# Patient Record
Sex: Female | Born: 1970 | Race: White | Hispanic: No | Marital: Married | Smoking: Never smoker
Health system: Southern US, Community
[De-identification: ages and names within clinical notes are randomized; demographics above are authoritative.]

## PROBLEM LIST (undated history)

## (undated) HISTORY — PX: CHOLECYSTECTOMY: SHX55

## (undated) HISTORY — PX: APPENDECTOMY: SHX54

## (undated) HISTORY — PX: KIDNEY SURGERY: SHX687

## (undated) HISTORY — PX: SHOULDER ARTHROSCOPY: SHX128

---

## 2018-04-03 DIAGNOSIS — R21 Rash and other nonspecific skin eruption: Secondary | ICD-10-CM | POA: Diagnosis not present

## 2018-04-17 DIAGNOSIS — Z1231 Encounter for screening mammogram for malignant neoplasm of breast: Secondary | ICD-10-CM | POA: Diagnosis not present

## 2018-04-19 DIAGNOSIS — L814 Other melanin hyperpigmentation: Secondary | ICD-10-CM | POA: Diagnosis not present

## 2018-04-19 DIAGNOSIS — L821 Other seborrheic keratosis: Secondary | ICD-10-CM | POA: Diagnosis not present

## 2018-04-19 DIAGNOSIS — D225 Melanocytic nevi of trunk: Secondary | ICD-10-CM | POA: Diagnosis not present

## 2018-06-21 ENCOUNTER — Other Ambulatory Visit: Payer: Self-pay | Admitting: Internal Medicine

## 2018-06-21 DIAGNOSIS — H539 Unspecified visual disturbance: Secondary | ICD-10-CM | POA: Diagnosis not present

## 2018-06-21 DIAGNOSIS — R42 Dizziness and giddiness: Secondary | ICD-10-CM | POA: Diagnosis not present

## 2018-06-21 DIAGNOSIS — R51 Headache: Secondary | ICD-10-CM | POA: Diagnosis not present

## 2018-06-21 DIAGNOSIS — G43809 Other migraine, not intractable, without status migrainosus: Secondary | ICD-10-CM | POA: Diagnosis not present

## 2018-06-21 DIAGNOSIS — G4489 Other headache syndrome: Secondary | ICD-10-CM

## 2018-06-21 DIAGNOSIS — Z83511 Family history of glaucoma: Secondary | ICD-10-CM | POA: Diagnosis not present

## 2018-07-04 ENCOUNTER — Other Ambulatory Visit: Payer: Self-pay

## 2018-07-04 ENCOUNTER — Ambulatory Visit
Admission: RE | Admit: 2018-07-04 | Discharge: 2018-07-04 | Disposition: A | Discharge status: Home / Self Care | Payer: 59 | Source: Ambulatory Visit | Attending: Internal Medicine | Admitting: Internal Medicine

## 2018-07-04 DIAGNOSIS — H539 Unspecified visual disturbance: Secondary | ICD-10-CM

## 2018-07-04 DIAGNOSIS — G4489 Other headache syndrome: Secondary | ICD-10-CM

## 2018-07-04 IMAGING — MR MRI HEAD WITHOUT CONTRAST
10 series · 48 of 48 positions shown · non-contrast
Comparison: None.

CLINICAL DATA: Headaches with vision changes since [REDACTED] [6O].

EXAM:
MRI HEAD WITHOUT CONTRAST
TECHNIQUE: Multiplanar, multiecho pulse sequences of the brain and surrounding
structures were obtained without intravenous contrast.

[Series 2: t1_se_sag · sagittal · 5.0mm · 0.45mm/px · 1 of 19 slices shown]
[im 1/19]
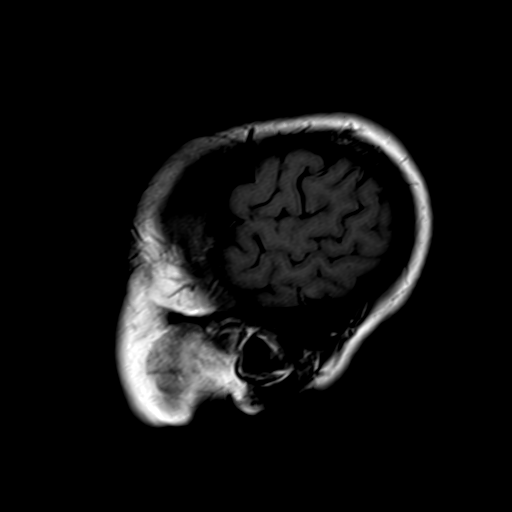

[Series 3: t2_tse_tra_512 · axial · 5.0mm · 0.72mm/px · z∈[-56,+94]mm · 3 of 26 slices shown]
[im 1/26]
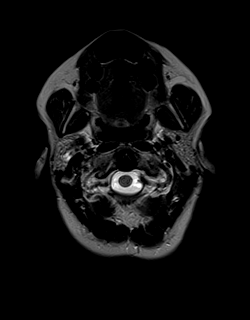
[im 13/26]
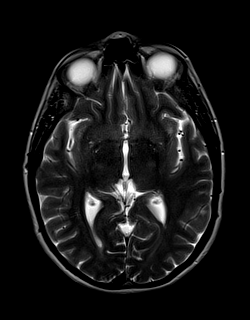
[im 26/26]
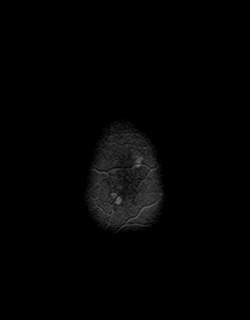

[Series 4: ep2d_diff_(id)_trace · axial · 3.0mm · 1.80mm/px · z∈[-50,+93]mm · 9 of 97 slices shown]
[im 1/97]
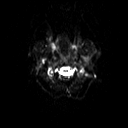
[im 13/97]
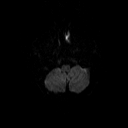
[im 25/97]
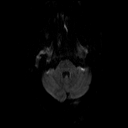
[im 37/97]
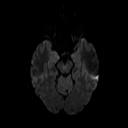
[im 49/97]
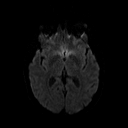
[im 61/97]
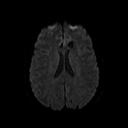
[im 73/97]
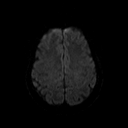
[im 85/97]
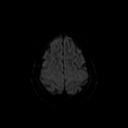
[im 97/97]
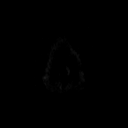

[Series 5: ep2d_diff_(id)_trace_adc · axial · 3.0mm · 1.80mm/px · z∈[-50,+93]mm · 5 of 47 slices shown]
[im 1/47]
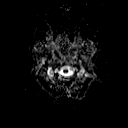
[im 12/47]
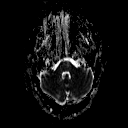
[im 24/47]
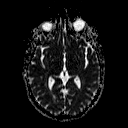
[im 35/47]
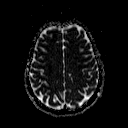
[im 47/47]
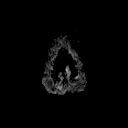

[Series 6: ep2d_diff_cor · coronal · 5.0mm · 1.77mm/px · 5 of 50 slices shown]
[im 1/50]
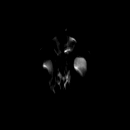
[im 13/50]
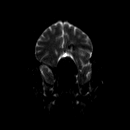
[im 25/50]
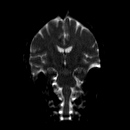
[im 37/50]
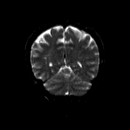
[im 50/50]
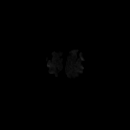

[Series 7: ep2d_diff_cor_adc · coronal · 5.0mm · 1.77mm/px · 2 of 25 slices shown]
[im 1/25]
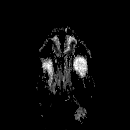
[im 25/25]
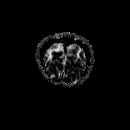

[Series 9: swi_images · axial · 4.0mm · 0.90mm/px · z∈[-51,+89]mm · 3 of 36 slices shown]
[im 1/36]
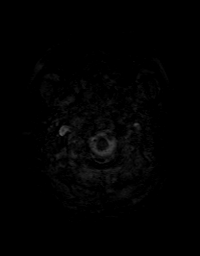
[im 18/36]
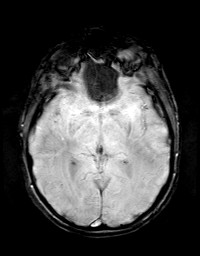
[im 36/36]
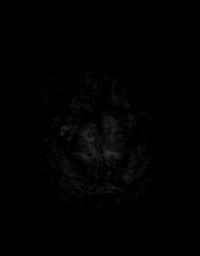

[Series 10: FLAIR · axial · 3.0mm · 0.43mm/px · z∈[-60,+96]mm · 3 of 27 slices shown]
[im 1/27]
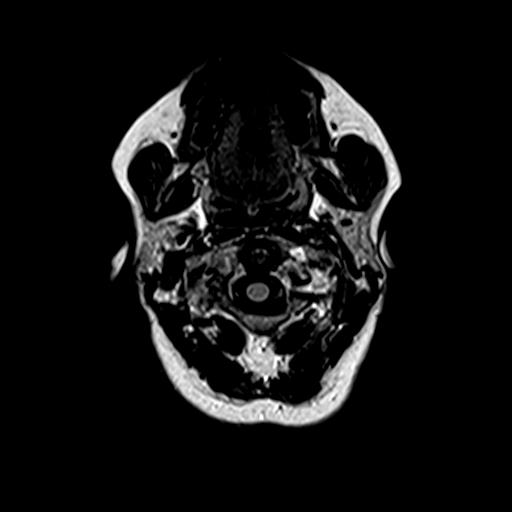
[im 14/27]
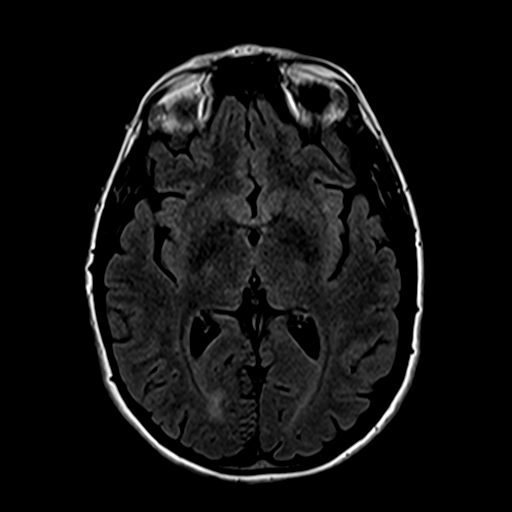
[im 27/27]
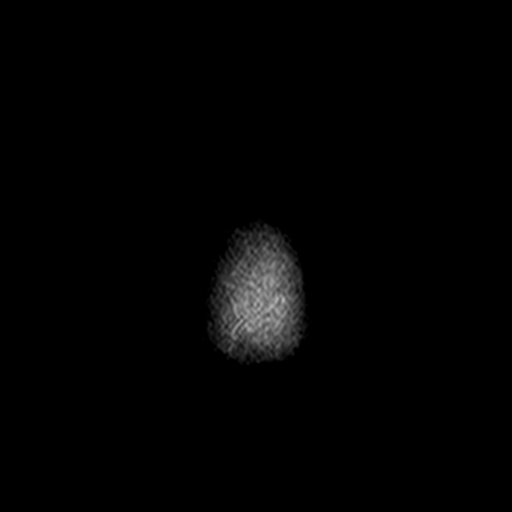

[Series 11: t1_mpr_tra · axial · 1.0mm · 0.72mm/px · z∈[-52,+90]mm · 14 of 144 slices shown]
[im 1/144]
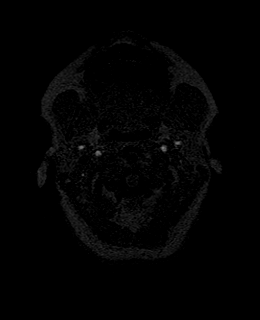
[im 12/144]
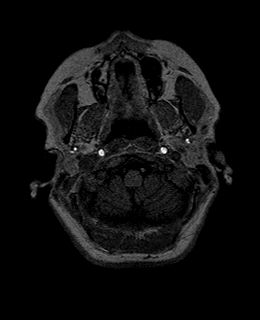
[im 23/144]
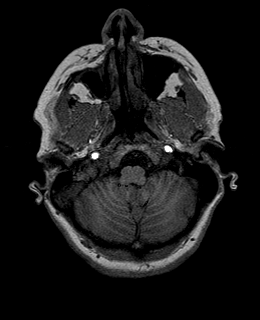
[im 34/144]
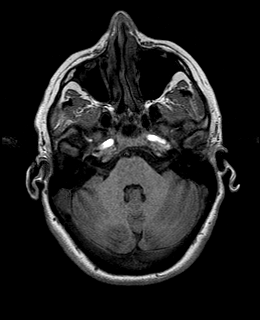
[im 45/144]
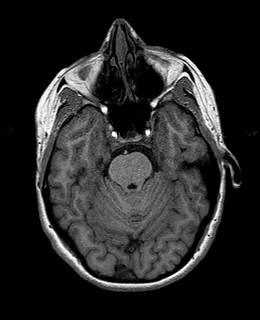
[im 56/144]
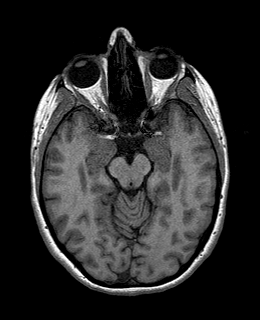
[im 67/144]
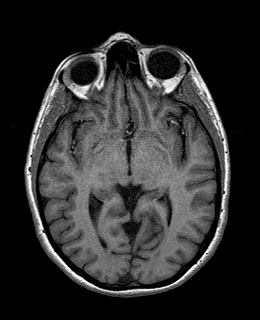
[im 78/144]
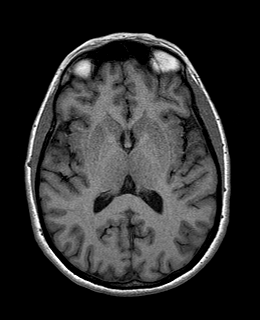
[im 89/144]
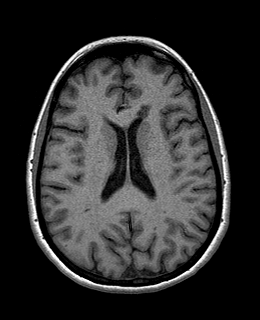
[im 100/144]
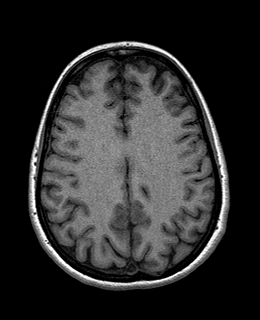
[im 111/144]
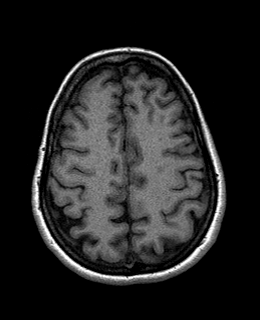
[im 122/144]
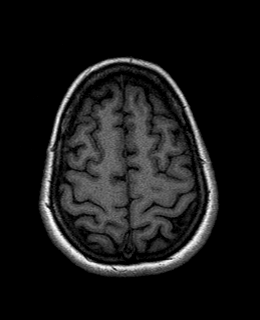
[im 133/144]
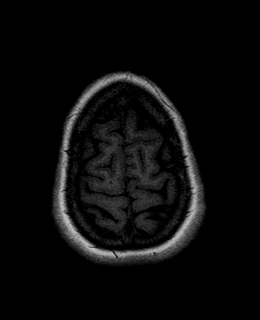
[im 144/144]
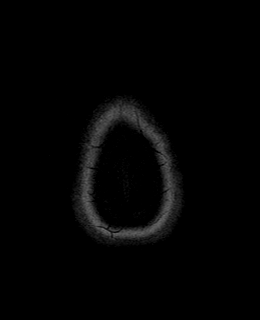

[Series 12: T2 · coronal · 5.0mm · 0.45mm/px · 3 of 26 slices shown]
[im 1/26]
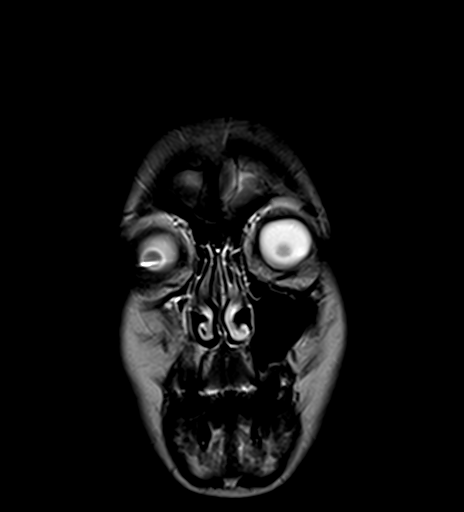
[im 13/26]
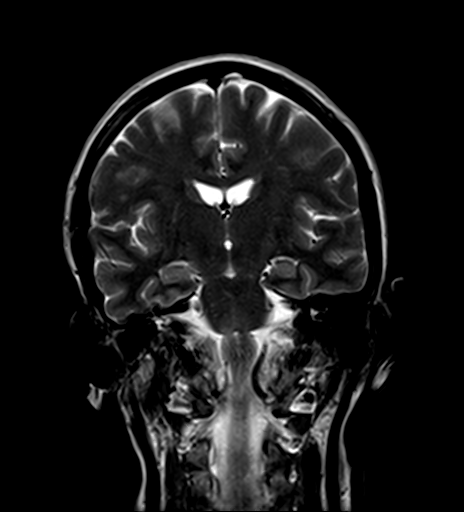
[im 26/26]
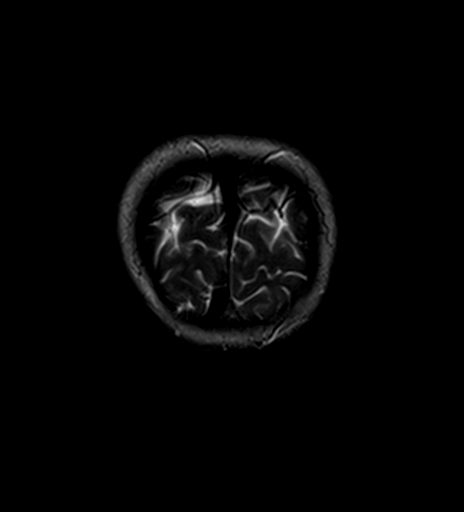

[48 of 48 positions shown; findings below may reference images not displayed]

FINDINGS: Brain: No acute infarct, intracranial mass effect, or extra-axial
fluid collection is identified. The ventricles and sulci are normal
in size. There is a 12 x 8 mm lesion abutting the frontal horn of
the left lateral ventricle which demonstrates heterogeneous T2
hyperintensity, a hemosiderin ring, and extensive susceptibility
artifact related to chronic blood products. There is no associated
edema. The brain is otherwise normal in signal.

Vascular: Major intracranial vascular flow voids are preserved.

Skull and upper cervical spine: Unremarkable bone marrow signal.

Sinuses/Orbits: Unremarkable orbits. Mild right maxillary and right
sphenoid sinus mucosal thickening. Clear mastoid air cells.

Other: None.
IMPRESSION: 1. 12 mm left frontal cavernoma. No edema or other evidence of acute
hemorrhage.
2. Otherwise unremarkable appearance of the brain.

## 2018-07-10 NOTE — Progress Notes (Signed)
error 

## 2018-07-11 ENCOUNTER — Telehealth: Payer: 59 | Admitting: Neurology

## 2018-07-11 ENCOUNTER — Other Ambulatory Visit: Payer: Self-pay

## 2018-07-11 ENCOUNTER — Encounter: Payer: Self-pay | Admitting: Neurology

## 2018-07-24 ENCOUNTER — Encounter: Payer: Self-pay | Admitting: Neurology

## 2018-07-24 ENCOUNTER — Other Ambulatory Visit: Payer: Self-pay

## 2018-07-24 ENCOUNTER — Ambulatory Visit: Payer: 59 | Admitting: Neurology

## 2018-07-24 VITALS — BP 111/76 | HR 90 | Temp 98.4°F | Ht 66.0 in | Wt 127.0 lb

## 2018-07-24 DIAGNOSIS — G43109 Migraine with aura, not intractable, without status migrainosus: Secondary | ICD-10-CM

## 2018-07-24 MED ORDER — RIZATRIPTAN BENZOATE 10 MG PO TABS: ORAL_TABLET | ORAL | 0 refills | Status: DC

## 2018-07-24 NOTE — Patient Instructions (Signed)
1.  Take rizatriptan 10mg  at earliest onset of migraine.  May repeat once in 2 hours if needed.  Maximum 2 tablets in 24 hours. 2.  Follow up in one year.

## 2018-07-24 NOTE — Progress Notes (Signed)
NEUROLOGY CONSULTATION NOTE  April Yoder MRN: [id number] DOB: [birth year]  Referring provider: Wenda Low, MD Primary care provider: Wenda Low, MD  Reason for consult:  migraine  HISTORY OF PRESENT ILLNESS: April Yoder is a 48 year old woman who presents for migraine.  In April, she had visual disturbance described as flashes in vision with development of scotoma surrounded by bright flashes or prism effect.  It involves both eyes.  She is unable to see. Towards the end of the day, she developed a headache, severe left frontal non-throbbing headache.  It was associated with dizziness but not nausea, vomiting, photophobia, phonophobia or unilateral numbness or weakness.  Visual aura and headache are severe the first day but both symptoms last and gradually resolve over 3 to 4 days.  No specific triggers.  Resolved spontaneously.  She had an eye exam which was normal.   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  No stroke.  She had a similar event last summer but not as severe, lasting about a day.  No history of headaches.  Her sister has migraines.  PAST MEDICAL HISTORY: History reviewed. No pertinent past medical history.  PAST SURGICAL HISTORY: Past Surgical History:  Procedure Laterality Date  . APPENDECTOMY    . CHOLECYSTECTOMY    . KIDNEY SURGERY Left    left kidney removed  . SHOULDER ARTHROSCOPY Bilateral     MEDICATIONS: Current Outpatient Medications on File Prior to Visit  Medication Sig Dispense Refill  . loratadine (CLARITIN) 10 MG tablet Take 10 mg by mouth daily.    . Multiple Vitamin (MULTIVITAMIN) tablet Take 1 tablet by mouth daily.     No current facility-administered medications on file prior to visit.     ALLERGIES: Not on File  FAMILY HISTORY: Family History  Problem Relation Age of Onset  . Breast cancer  Mother 16  . Polycystic kidney disease Father   . Polycystic kidney disease Sister     SOCIAL HISTORY: Social History   Socioeconomic History  . Marital status: Married    Spouse name: April Yoder  . Number of children: 2  . Years of education: Not on file  . Highest education level: Some college, no degree  Occupational History  . Occupation: retired  Scientific laboratory technician  . Financial resource strain: Not on file  . Food insecurity:    Worry: Not on file    Inability: Not on file  . Transportation needs:    Medical: Not on file    Non-medical: Not on file  Tobacco Use  . Smoking status: Never Smoker  . Smokeless tobacco: Never Used  Substance and Sexual Activity  . Alcohol use: Yes    Frequency: Never    Comment: 2 x week  . Drug use: Never  . Sexual activity: Yes  Lifestyle  . Physical activity:    Days per week: Not on file    Minutes per session: Not on file  . Stress: Not on file  Relationships  . Social connections:    Talks on phone: Not on file    Gets together: Not on file    Attends religious service: Not on file    Active member of club or organization: Not on file    Attends meetings of clubs or organizations: Not on file    Relationship status: Not on file  . Intimate partner violence:  Fear of current or ex partner: Not on file    Emotionally abused: Not on file    Physically abused: Not on file    Forced sexual activity: Not on file  Other Topics Concern  . Not on file  Social History Narrative   Patient is left-handed. She lives with her husband in a one level home. She drinks 2 cups of coffee a day and an occasional cup of herbal tea. Prior to [place] restrictions, she went to the gym 3 x a week. Recently she has been trying to ride her bike 3 x week.    REVIEW OF SYSTEMS: Constitutional: No fevers, chills, or sweats, no generalized fatigue, change in appetite Eyes: No visual changes, double vision, eye pain Ear, nose and throat: No hearing loss, ear pain,  nasal congestion, sore throat Cardiovascular: No chest pain, palpitations Respiratory:  No shortness of breath at rest or with exertion, wheezes GastrointestinaI: No nausea, vomiting, diarrhea, abdominal pain, fecal incontinence Genitourinary:  No dysuria, urinary retention or frequency Musculoskeletal:  No neck pain, back pain Integumentary: No rash, pruritus, skin lesions Neurological: as above Psychiatric: No depression, insomnia, anxiety Endocrine: No palpitations, fatigue, diaphoresis, mood swings, change in appetite, change in weight, increased thirst Hematologic/Lymphatic:  No purpura, petechiae. Allergic/Immunologic: no itchy/runny eyes, nasal congestion, recent allergic reactions, rashes  PHYSICAL EXAM: Blood pressure 111/76, pulse 90, temperature 98.4 F (36.9 C), height 5\' 6"  (1.676 m), weight 127 lb (57.6 kg), SpO2 98 %. General: No acute distress.  Patient appears well-groomed.  Head:  Normocephalic/atraumatic Eyes:  fundi examined but not visualized Neck: supple, no paraspinal tenderness, full range of motion Back: No paraspinal tenderness Heart: regular rate and rhythm Lungs: Clear to auscultation bilaterally. Vascular: No carotid bruits. Neurological Exam: Mental status: alert and oriented to person, place, and time, recent and remote memory intact, fund of knowledge intact, attention and concentration intact, speech fluent and not dysarthric, language intact. Cranial nerves: CN I: not tested CN II: pupils equal, round and reactive to light, visual fields intact CN III, IV, VI:  full range of motion, no nystagmus, no ptosis CN V: facial sensation intact CN VII: upper and lower face symmetric CN VIII: hearing intact CN IX, X: gag intact, uvula midline CN XI: sternocleidomastoid and trapezius muscles intact CN XII: tongue midline Bulk & Tone: normal, no fasciculations. Motor:  5/5 throughout  Sensation: temperature and vibration sensation intact. Deep Tendon  Reflexes:  2+ throughout, toes downgoing.  Finger to nose testing:  Without dysmetria.  Heel to shin:  Without dysmetria.  Gait:  Normal station and stride.  Able to turn and tandem walk. Romberg negative.  IMPRESSION: Migraine with aura, without status migrainosus, not intractable  PLAN: 1.  Will prescribe rizatriptan for migraine abortive therapy 2.  As migraines are not frequent, defer preventative medication at this time. 3.  Follow up in one year or as needed.  Thank you for allowing me to take part in the care of this patient.  Metta Clines, DO  CC: Wenda Low, MD

## 2019-01-04 ENCOUNTER — Other Ambulatory Visit: Payer: Self-pay | Admitting: General Surgery

## 2019-01-04 DIAGNOSIS — K439 Ventral hernia without obstruction or gangrene: Secondary | ICD-10-CM

## 2019-01-14 ENCOUNTER — Ambulatory Visit
Admission: RE | Admit: 2019-01-14 | Discharge: 2019-01-14 | Disposition: A | Discharge status: Home / Self Care | Payer: 59 | Source: Ambulatory Visit | Attending: General Surgery | Admitting: General Surgery

## 2019-01-14 ENCOUNTER — Other Ambulatory Visit: Payer: Self-pay

## 2019-01-14 DIAGNOSIS — K439 Ventral hernia without obstruction or gangrene: Secondary | ICD-10-CM

## 2019-01-14 IMAGING — CT CT ABD-PELV W/ CM
1 of 3 series · 14 of 32 positions shown, 19 images · IV contrast (APPLIED)
Comparison: None.

CLINICAL DATA: Periumbilical pain. Concern for hernia.

EXAM:
CT ABDOMEN AND PELVIS WITH CONTRAST
TECHNIQUE: Multidetector CT imaging of the abdomen and pelvis was performed
using the standard protocol following bolus administration of
intravenous contrast.
CONTRAST:  100mL [O0] IOPAMIDOL ([O0]) INJECTION 61%

[Series 2: abd/pelvis w/cm · axial · 0.66mm/px · z∈[-441,-61]mm · 14 of 88 slices shown, 19 images]
[im 6/88  soft-tissue]
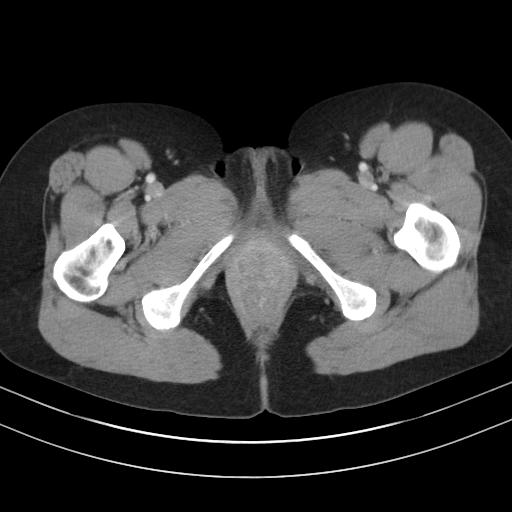
[im 6/88  bone]
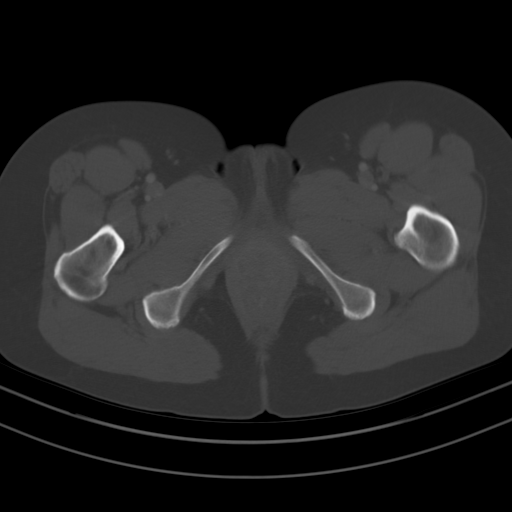
[im 11/88  soft-tissue]
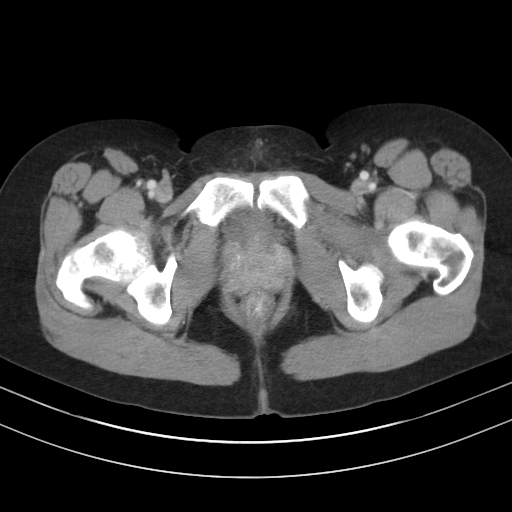
[im 21/88  soft-tissue]
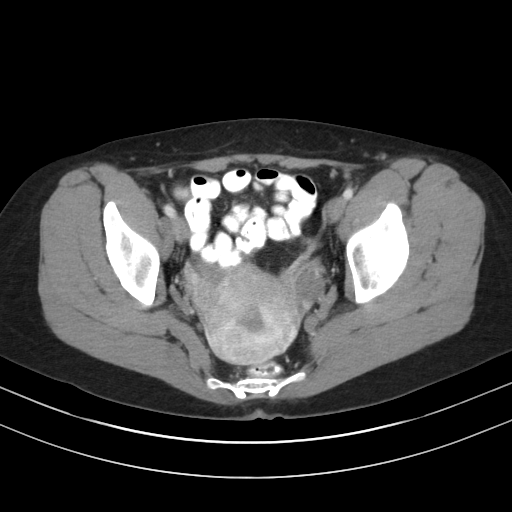
[im 26/88  soft-tissue]
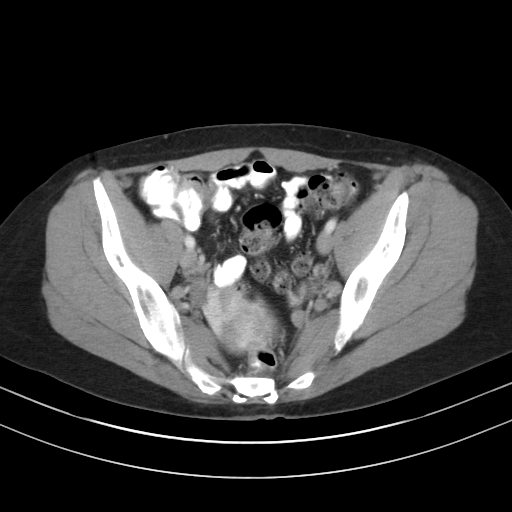
[im 31/88  soft-tissue]
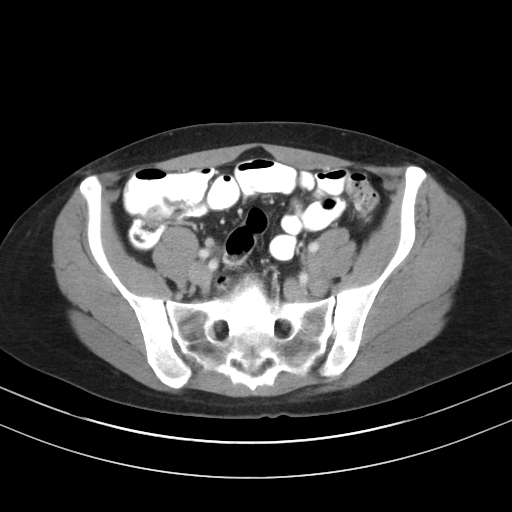
[im 36/88  soft-tissue]
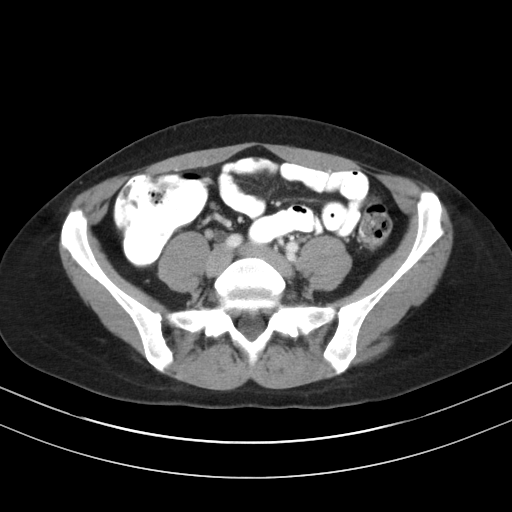
[im 47/88  soft-tissue]
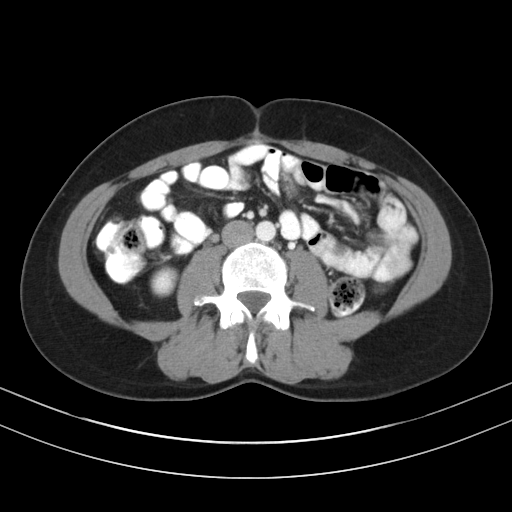
[im 52/88  soft-tissue]
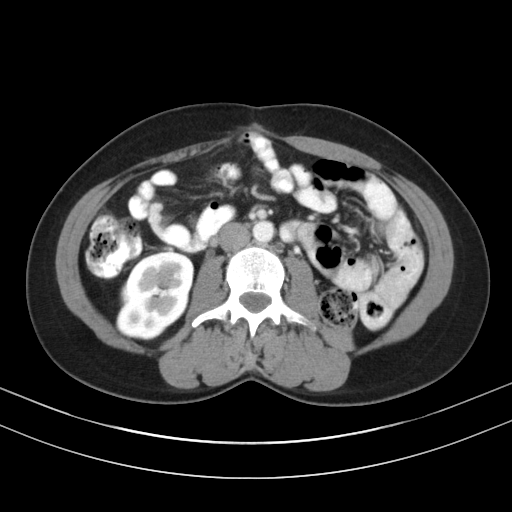
[im 57/88  soft-tissue]
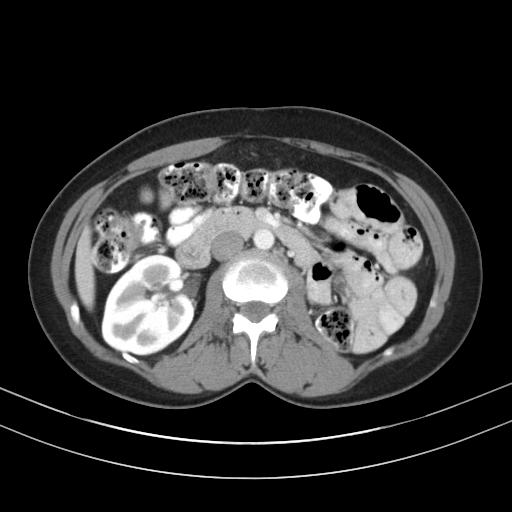
[im 57/88  bone]
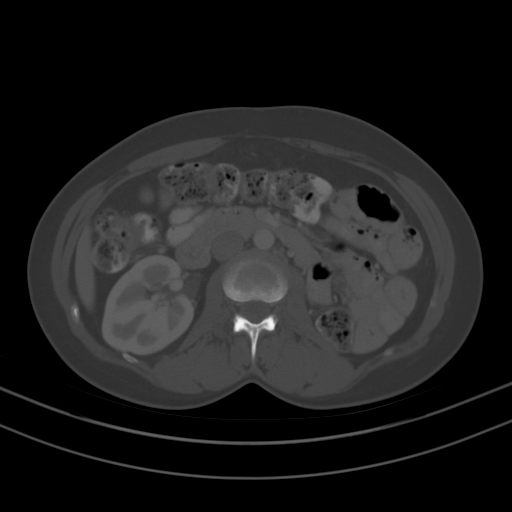
[im 62/88  soft-tissue]
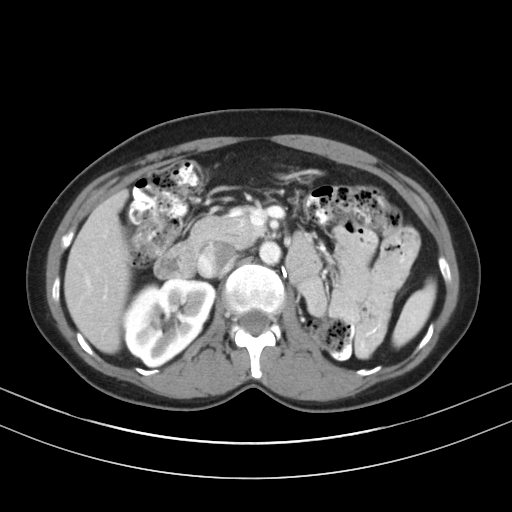
[im 67/88  soft-tissue]
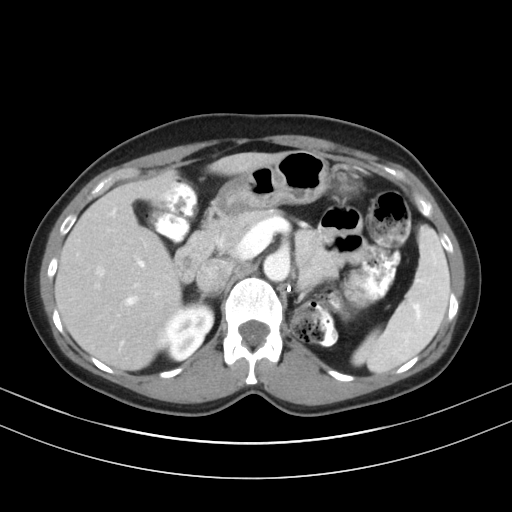
[im 67/88  lung]
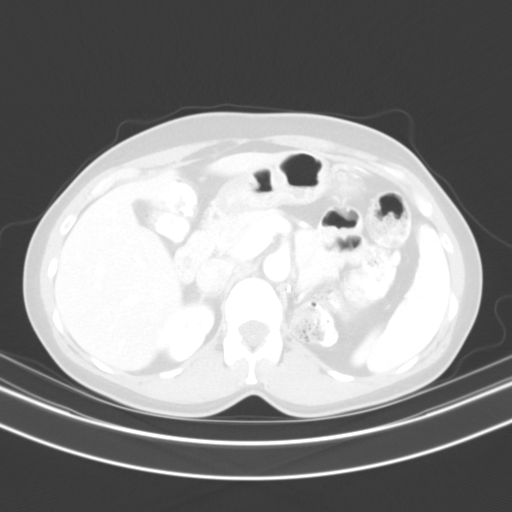
[im 72/88  lung]
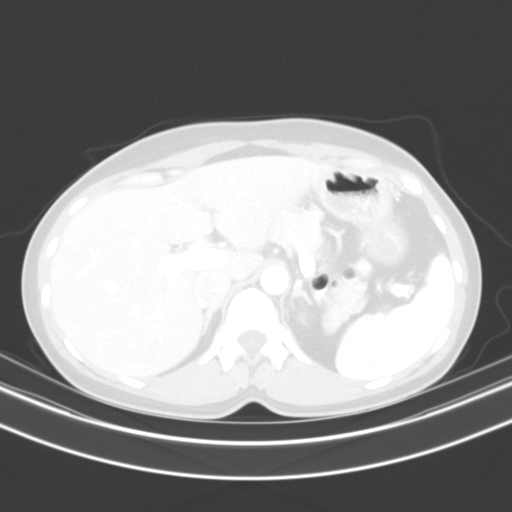
[im 77/88  soft-tissue]
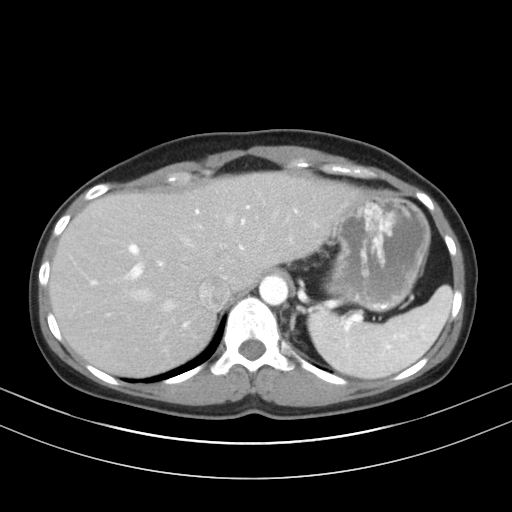
[im 77/88  lung]
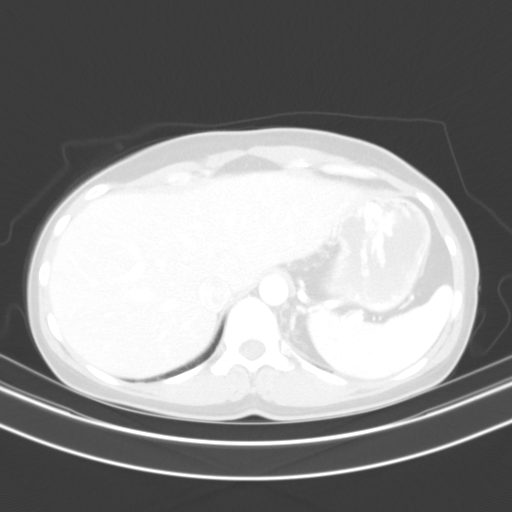
[im 82/88  soft-tissue]
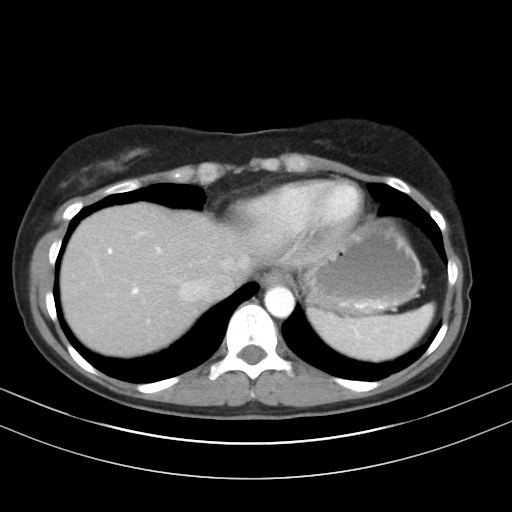
[im 82/88  lung]
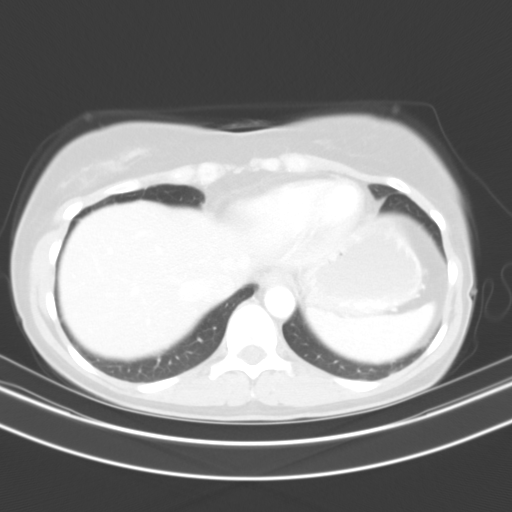

[14 of 32 positions shown; findings below may reference images not displayed]

FINDINGS: Lower chest: Minimal dependent atelectasis in the lung bases. No
pleural effusion.

Hepatobiliary: No focal liver abnormality is seen. Status post
cholecystectomy. No biliary dilatation.

Pancreas: Unremarkable.

Spleen: 1.4 cm mildly hypoattenuating/hypoenhancing lesion in the
spleen.

Adrenals/Urinary Tract: Unremarkable adrenal glands. Status post
left nephrectomy with history of kidney donation. Unremarkable
appearance of the right kidney. Unremarkable bladder.

Stomach/Bowel: The stomach is unremarkable. There is no evidence of
bowel obstruction or inflammation. Previous appendectomy.

Vascular/Lymphatic: Normal caliber of the abdominal aorta. No
enlarged lymph nodes.

Reproductive: Diffusely heterogeneous and mildly enlarged uterus
with a discrete 2.6 cm intramural mass anteriorly. No adnexal mass.

Other: No intraperitoneal free fluid. Mild soft tissue thickening in
the anterior abdominal wall at the level of the umbilicus, likely
postoperative scarring. No abdominal wall hernia.

Musculoskeletal: No acute osseous abnormality or suspicious osseous
lesion.
IMPRESSION: 1. No hernia or acute abnormality identified.
2. Uterine fibroids.
3. 1.4 cm indeterminate splenic lesion. In the absence of a history
of malignancy, a follow-up abdominal MRI is recommended in 6 months.

## 2019-01-14 MED ORDER — IOPAMIDOL (ISOVUE-300) INJECTION 61%
100.0000 mL | Freq: Once | INTRAVENOUS | Status: AC | PRN
  Administered 2019-01-14: 100 mL via INTRAVENOUS

## 2019-07-23 NOTE — Progress Notes (Signed)
NEUROLOGY FOLLOW UP OFFICE NOTE  SEMONE SCHERER ME:[id number]  HISTORY OF PRESENT ILLNESS: April Yoder is a 49 year old female who follows up for migraines.  UPDATE: Intensity:  5-6/10 Duration:  Takes rizatriptan and goes to bed.  She wakes up after a couple hours and headache is reduced at 1-2/10 Frequency:  6 to 7 days over past year.   Severe They usually occur 4 or 5 days prior to period.  3 days this past year she couldn't function because she couldn't see due to the visual aura.  Current NSAIDS:  none Current analgesics:  none Current triptans:  Rizatriptan 10mg  Current ergotamine:  none Current anti-emetic:  none Current muscle relaxants:  none Current anti-anxiolytic:  alprazolam Current sleep aide:  none Current Antihypertensive medications:  none Current Antidepressant medications:  none Current Anticonvulsant medications:  none Current anti-CGRP:  none Current Vitamins/Herbal/Supplements:  MVI Current Antihistamines/Decongestants:  Claritin Other therapy:  none  HISTORY: In April 2020, she had visual disturbance described as flashes in vision with development of scotoma surrounded by bright flashes or prism effect.  It involves both eyes.  She is unable to see. Towards the end of the day, she developed a headache, severe left frontal non-throbbing headache.  It was associated with dizziness but not nausea, vomiting, photophobia, phonophobia or unilateral numbness or weakness.  Visual aura and headache are severe the first day but both symptoms last and gradually resolve over 3 to 4 days.  No specific triggers.  Resolved spontaneously.  She had an eye exam which was normal.   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  No stroke.  She had a similar event in summer of 2019 but not as severe, lasting about a day.  No history of headaches.  Her sister has  migraines.  PAST MEDICAL HISTORY: No past medical history on file.  MEDICATIONS: Current Outpatient Medications on File Prior to Visit  Medication Sig Dispense Refill  . loratadine (CLARITIN) 10 MG tablet Take 10 mg by mouth daily.    . Multiple Vitamin (MULTIVITAMIN) tablet Take 1 tablet by mouth daily.    . rizatriptan (MAXALT) 10 MG tablet Take 1 tablet earliest onset of migraine.  May repeat in 2 hours if needed.  Maximum 2 tablets in 24 hrs. 10 tablet 0   No current facility-administered medications on file prior to visit.    ALLERGIES: Not on File  FAMILY HISTORY: Family History  Problem Relation Age of Onset  . Breast cancer Mother 14  . Polycystic kidney disease Father   . Polycystic kidney disease Sister     SOCIAL HISTORY: Social History   Socioeconomic History  . Marital status: Married    Spouse name: Clare Gandy  . Number of children: 2  . Years of education: Not on file  . Highest education level: Some college, no degree  Occupational History  . Occupation: retired  Tobacco Use  . Smoking status: Never Smoker  . Smokeless tobacco: Never Used  Substance and Sexual Activity  . Alcohol use: Yes    Comment: 2 x week  . Drug use: Never  . Sexual activity: Yes  Other Topics Concern  . Not on file  Social History Narrative   Patient is left-handed. She lives with her husband in a one level home. She drinks 2 cups of coffee a day and an occasional cup of herbal tea. Prior to [place]  restrictions, she went to the gym 3 x a week. Recently she has been trying to ride her bike 3 x week.   Social Determinants of Health   Financial Resource Strain:   . Difficulty of Paying Living Expenses:   Food Insecurity:   . Worried About Charity fundraiser in the Last Year:   . Arboriculturist in the Last Year:   Transportation Needs:   . Film/video editor (Medical):   Marland Kitchen Lack of Transportation (Non-Medical):   Physical Activity:   . Days of Exercise per Week:   . Minutes  of Exercise per Session:   Stress:   . Feeling of Stress :   Social Connections:   . Frequency of Communication with Friends and Family:   . Frequency of Social Gatherings with Friends and Family:   . Attends Religious Services:   . Active Member of Clubs or Organizations:   . Attends Archivist Meetings:   Marland Kitchen Marital Status:   Intimate Partner Violence:   . Fear of Current or Ex-Partner:   . Emotionally Abused:   Marland Kitchen Physically Abused:   . Sexually Abused:     PHYSICAL EXAM: Blood pressure 119/87, pulse 77, height 5\' 6"  (1.676 m), weight 130 lb 9.6 oz (59.2 kg), SpO2 98 %. General: No acute distress.  Patient appears well-groomed.   Head:  Normocephalic/atraumatic Eyes:  Fundi examined but not visualized Neck: supple, no paraspinal tenderness, full range of motion Heart:  Regular rate and rhythm Lungs:  Clear to auscultation bilaterally Back: No paraspinal tenderness Neurological Exam: alert and oriented to person, place, and time. Attention span and concentration intact, recent and remote memory intact, fund of knowledge intact.  Speech fluent and not dysarthric, language intact.  CN II-XII intact. Bulk and tone normal, muscle strength 5/5 throughout.  Sensation to light touch, temperature and vibration intact.  Deep tendon reflexes 2+ throughout, toes downgoing.  Finger to nose and heel to shin testing intact.  Gait normal, Romberg negative.  IMPRESSION: Migraine with aura, without status migrainosus, not intractable Cerebral cavernoma  PLAN: 1.  Rizatriptan 10mg  PRN 2.  Limit use of pain relievers to no more than 2 days out of week to prevent risk of rebound or medication-overuse headache. 3.  Keep headache diary 4.  Repeat MRI of brain w/wo to follow up on cavernoma 5.  Follow up in one year  Metta Clines, DO  CC: Wenda Low, M D

## 2019-07-24 ENCOUNTER — Encounter: Payer: Self-pay | Admitting: Neurology

## 2019-07-24 ENCOUNTER — Other Ambulatory Visit: Payer: Self-pay

## 2019-07-24 ENCOUNTER — Ambulatory Visit: Payer: 59 | Admitting: Neurology

## 2019-07-24 VITALS — BP 119/87 | HR 77 | Ht 66.0 in | Wt 130.6 lb

## 2019-07-24 DIAGNOSIS — Q283 Other malformations of cerebral vessels: Secondary | ICD-10-CM

## 2019-07-24 DIAGNOSIS — G43109 Migraine with aura, not intractable, without status migrainosus: Secondary | ICD-10-CM

## 2019-07-24 NOTE — Patient Instructions (Addendum)
1.  Use rizatriptan as needed. 2.  Will repeat MRI of brain with and without contrast to follow up on cavernoma. We have sent a referral to [facility] for your MRI and they will call you directly to schedule your appointment. They are located at [place]. If you need to contact them directly please call [phone].  3.  Follow up in one year

## 2019-08-28 ENCOUNTER — Ambulatory Visit
Admission: RE | Admit: 2019-08-28 | Discharge: 2019-08-28 | Disposition: A | Discharge status: Home / Self Care | Payer: 59 | Source: Ambulatory Visit | Attending: Neurology | Admitting: Neurology

## 2019-08-28 ENCOUNTER — Other Ambulatory Visit: Payer: Self-pay

## 2019-08-28 DIAGNOSIS — Q283 Other malformations of cerebral vessels: Secondary | ICD-10-CM

## 2019-08-28 IMAGING — MR MR HEAD WO/W CM
13 series · 48 of 48 positions shown · IV contrast (multihance)
Comparison: Brain MRI [DATE]

CLINICAL DATA: Cerebral cavernoma.

EXAM:
MRI HEAD WITHOUT AND WITH CONTRAST
TECHNIQUE: Multiplanar, multiecho pulse sequences of the brain and surrounding
structures were obtained without and with intravenous contrast.
CONTRAST:  12mL MULTIHANCE GADOBENATE DIMEGLUMINE 529 MG/ML IV SOLN

[Series 2: t1_se_sag · sagittal · 5.0mm · 0.45mm/px · 1 of 19 slices shown (1 of 2)]
[im 1/19]
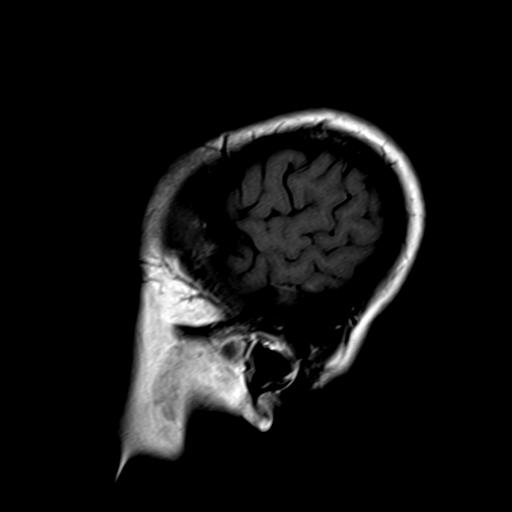

[Series 3: ep2d_diff_ · axial · 3.0mm · 1.80mm/px · z∈[-55,+88]mm · 7 of 96 slices shown]
[im 1/96]
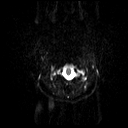
[im 16/96]
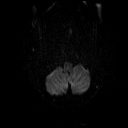
[im 32/96]
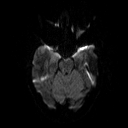
[im 48/96]
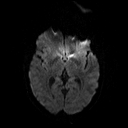
[im 64/96]
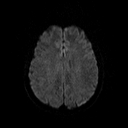
[im 80/96]
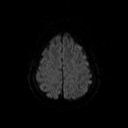
[im 96/96]
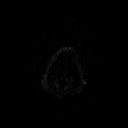

[Series 4: ep2d_diff__adc · axial · 3.0mm · 1.80mm/px · z∈[-55,+88]mm · 3 of 48 slices shown]
[im 1/48]
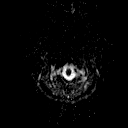
[im 24/48]
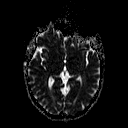
[im 48/48]
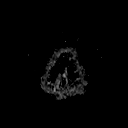

[Series 5: ep2d_diff_cor · coronal · 5.0mm · 1.77mm/px · 3 of 46 slices shown]
[im 1/46]
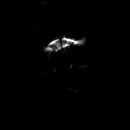
[im 23/46]
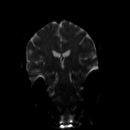
[im 46/46]
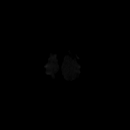

[Series 6: ep2d_diff_cor_adc · coronal · 5.0mm · 1.77mm/px · 2 of 25 slices shown]
[im 1/25]
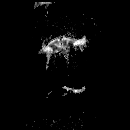
[im 25/25]
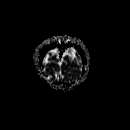

[Series 8: swi_images · axial · 4.0mm · 0.90mm/px · z∈[-53,+86]mm · 3 of 36 slices shown]
[im 1/36]
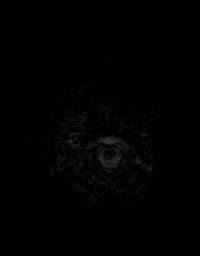
[im 18/36]
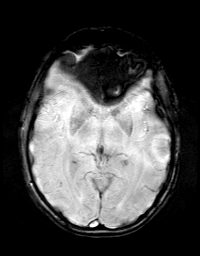
[im 36/36]
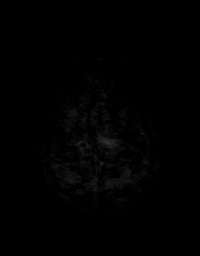

[Series 9: FLAIR · axial · 3.0mm · 0.43mm/px · z∈[-62,+94]mm · 2 of 27 slices shown]
[im 1/27]
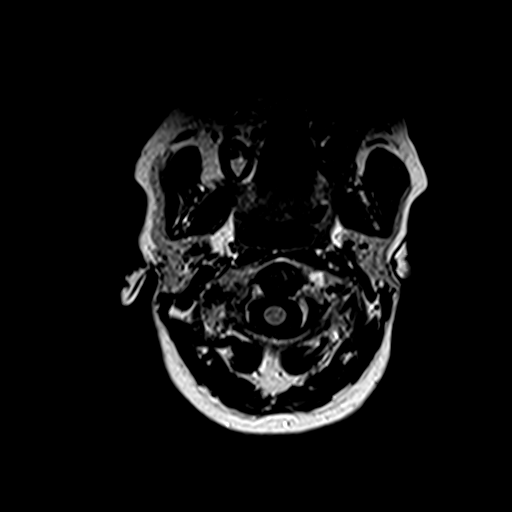
[im 27/27]
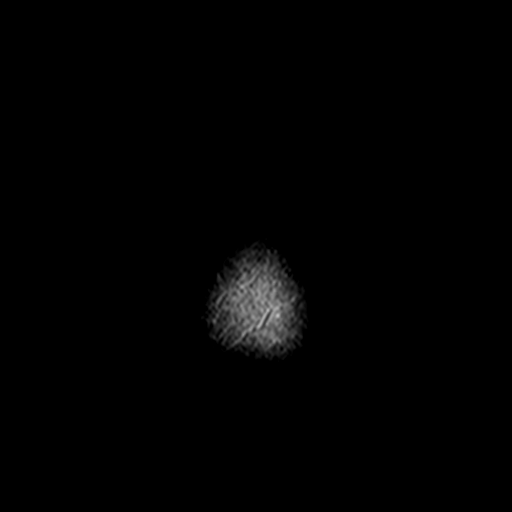

[Series 10: t1_mpr_tra · axial · 1.0mm · 0.72mm/px · z∈[-55,+88]mm · 10 of 144 slices shown (1 of 2)]
[im 1/144]
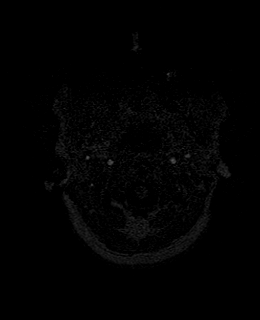
[im 16/144]
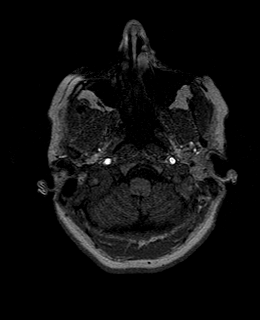
[im 32/144]
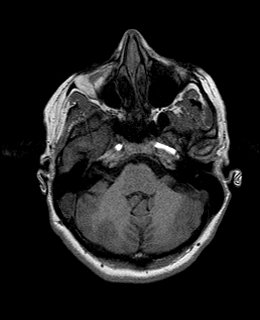
[im 48/144]
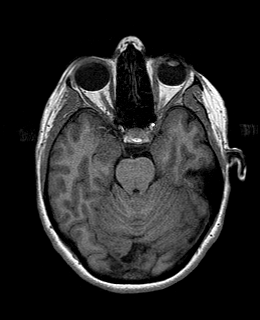
[im 64/144]
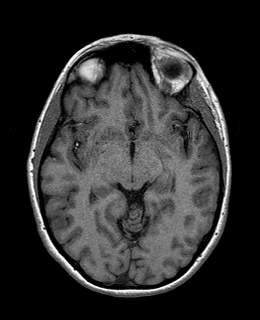
[im 80/144]
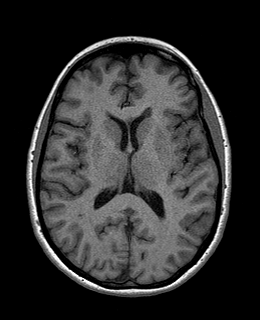
[im 96/144]
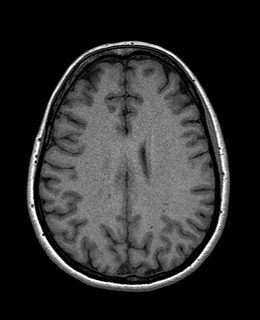
[im 112/144]
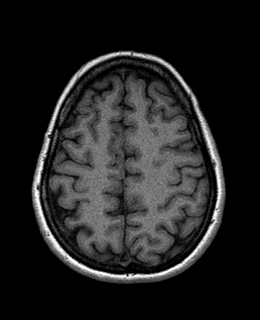
[im 128/144]
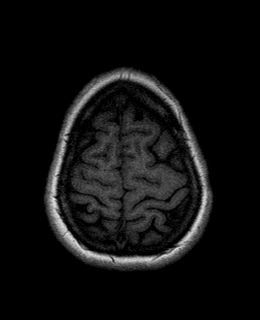
[im 144/144]
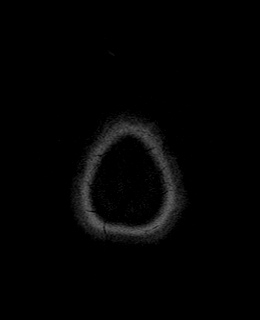

[Series 11: t2_tse_tra · axial · 5.0mm · 0.72mm/px · z∈[-58,+91]mm · 2 of 26 slices shown]
[im 1/26]
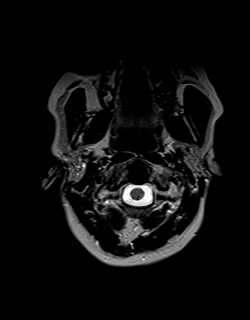
[im 26/26]
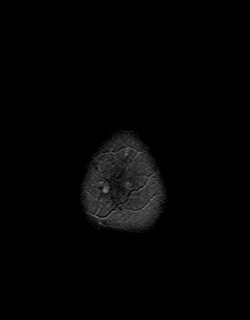

[Series 12: T2 · coronal · 5.0mm · 0.45mm/px · 2 of 26 slices shown]
[im 1/26]
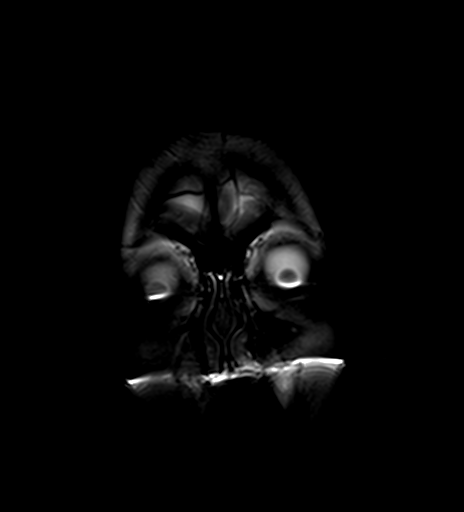
[im 26/26]
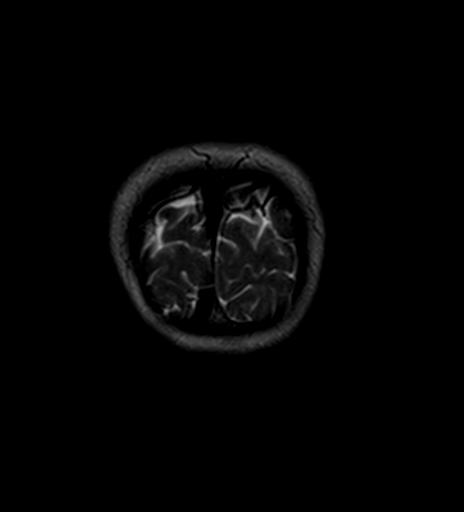

[Series 13: T1 post-contrast · coronal · 5.0mm · 0.72mm/px · 2 of 30 slices shown]
[im 1/30]
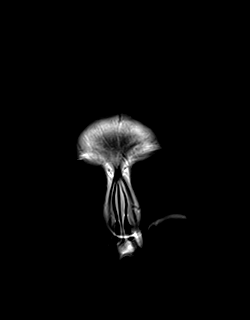
[im 30/30]
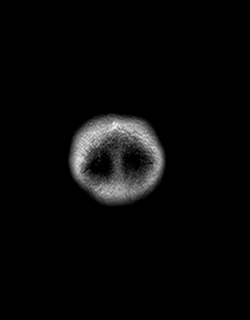

[Series 14: t1_mpr_tra · axial · 1.0mm · 0.72mm/px · z∈[-55,+88]mm · 10 of 144 slices shown (2 of 2)]
[im 1/144]
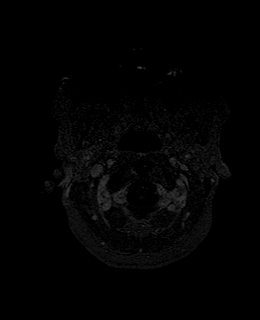
[im 16/144]
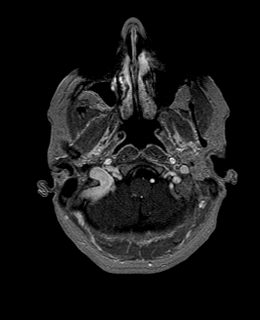
[im 32/144]
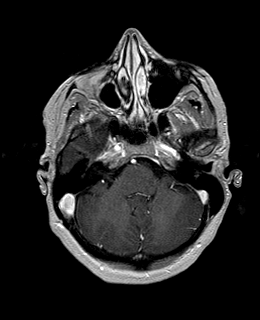
[im 48/144]
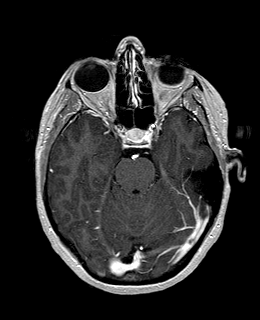
[im 64/144]
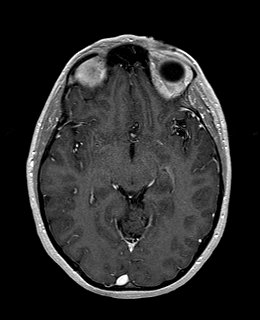
[im 80/144]
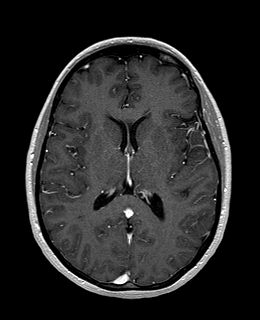
[im 96/144]
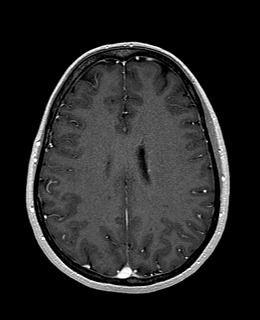
[im 112/144]
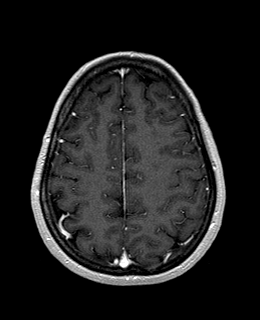
[im 128/144]
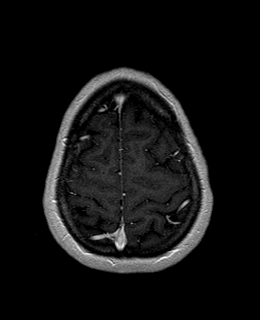
[im 144/144]
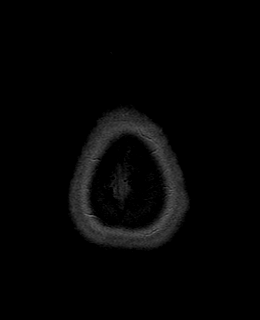

[Series 15: t1_se_sag · sagittal · 5.0mm · 0.45mm/px · 1 of 19 slices shown (2 of 2)]
[im 1/19]
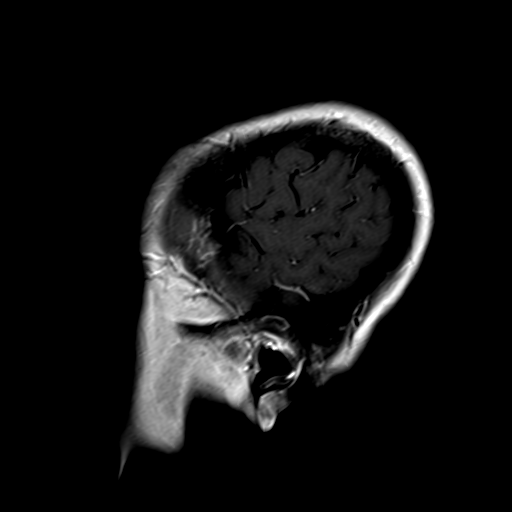

[48 of 48 positions shown; findings below may reference images not displayed]

FINDINGS: Brain:

Susceptibility artifact arising from dental hardware obscures
portions of the anterior intracranial contents on the susceptibility
weighted sequence. This slightly limits evaluation.

Unchanged size and appearance of a 12 mm parenchymal lesion abutting
the frontal horn of the left lateral ventricle, which demonstrates
heterogeneous T2 hyperintensity, a hemosiderin ring and prominent
susceptibility weighted signal loss. Findings are consistent with
cavernoma. There is no associated edema. There is a slightly
prominent venous vessel in this region which may reflect a tiny
associated developmental venous anomaly (series 14, image 85).

As before, there are a few scattered punctate foci of T2/FLAIR
hyperintensity within the cerebral white matter which are
nonspecific.

There is no acute infarct.

No extra-axial fluid collection.

No midline shift.

Vascular: Expected proximal arterial flow voids.

Skull and upper cervical spine: No focal marrow lesion.

Sinuses/Orbits: Visualized orbits show no acute finding. Mild
paranasal sinus mucosal thickening, most notably within the right
sphenoid sinus. No significant mastoid effusion.
IMPRESSION: Stable size and appearance of a 12 mm left frontal cavernoma as
compared to the MRI of [DATE]. There is a slightly prominent
venous vessel in this region which may reflect a small associated
developmental venous anomaly. There is no associated edema or
evidence of acute hemorrhage.

Mild paranasal sinus mucosal thickening, most notably right
sphenoid.

## 2019-08-28 MED ORDER — GADOBENATE DIMEGLUMINE 529 MG/ML IV SOLN
12.0000 mL | Freq: Once | INTRAVENOUS | Status: AC | PRN
  Administered 2019-08-28: 12 mL via INTRAVENOUS

## 2019-08-29 ENCOUNTER — Other Ambulatory Visit: Payer: Self-pay

## 2019-08-29 ENCOUNTER — Telehealth: Payer: Self-pay

## 2019-08-29 DIAGNOSIS — Q283 Other malformations of cerebral vessels: Secondary | ICD-10-CM

## 2019-08-29 NOTE — Telephone Encounter (Signed)
-----   Message from Pieter Partridge, DO sent at 08/29/2019  8:38 AM EDT ----- The cavernoma is stable.  No concerns.  I would still like to repeat MRI of brain with and without contrast in one year (should be performed prior to follow up in May)

## 2019-08-29 NOTE — Telephone Encounter (Signed)
MRI Results given to pt. Advised to repeat before her next f/u 06/2020. Order added

## 2019-10-10 ENCOUNTER — Other Ambulatory Visit: Payer: Self-pay | Admitting: Gastroenterology

## 2019-10-10 DIAGNOSIS — D7389 Other diseases of spleen: Secondary | ICD-10-CM

## 2019-10-17 ENCOUNTER — Ambulatory Visit
Admission: RE | Admit: 2019-10-17 | Discharge: 2019-10-17 | Disposition: A | Discharge status: Home / Self Care | Payer: 59 | Source: Ambulatory Visit | Attending: Gastroenterology | Admitting: Gastroenterology

## 2019-10-17 DIAGNOSIS — D7389 Other diseases of spleen: Secondary | ICD-10-CM

## 2019-10-17 IMAGING — US US ABDOMEN COMPLETE
1 series · 14 of 25 positions shown · non-contrast
Comparison: CT [DATE]

CLINICAL DATA: Splenic lesion

EXAM:
ABDOMEN ULTRASOUND COMPLETE

[Series 1: us abdomen complete · 0.22mm/px · 81 acquisitions, 14 frames shown]
[im 1/81]
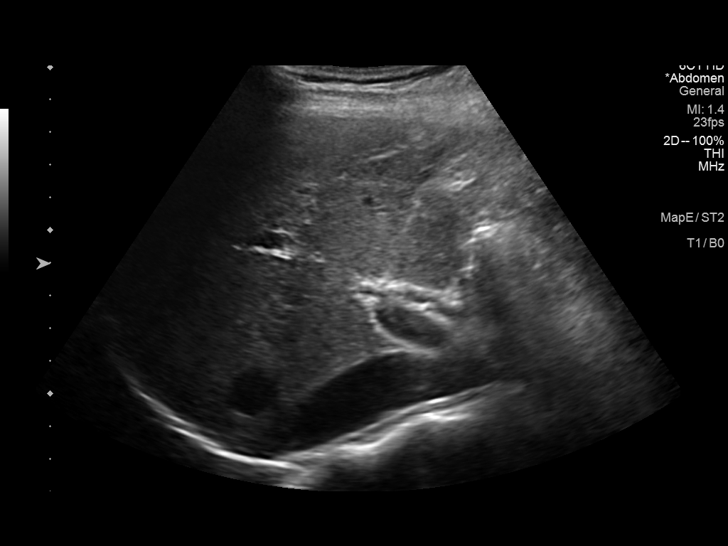
[im 7/81]
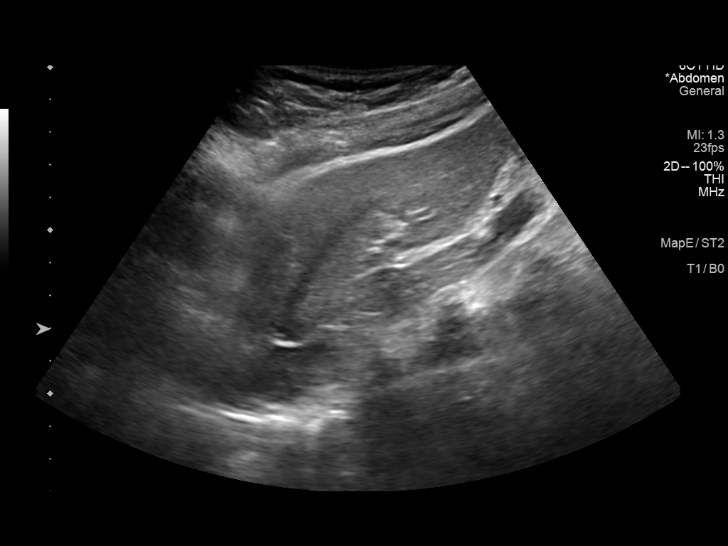
[im 14/81]
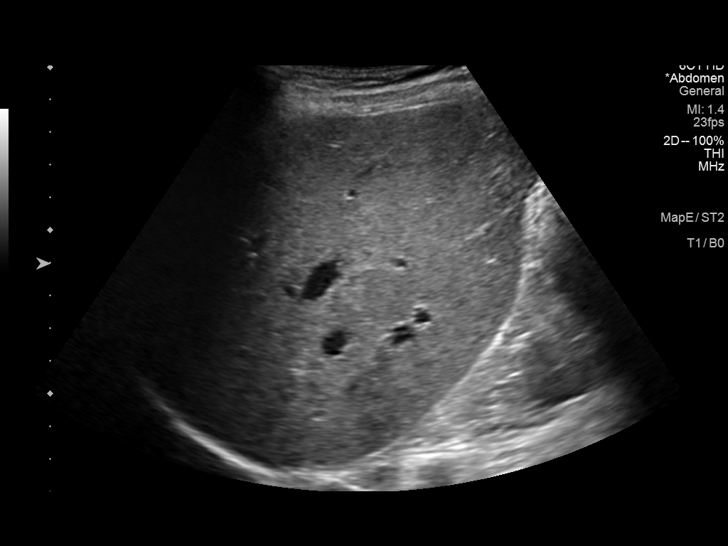
[im 21/81]
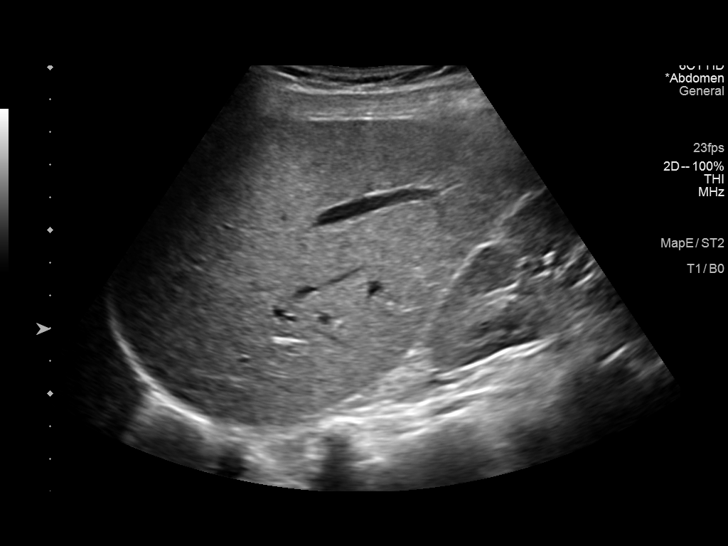
[im 27/81]
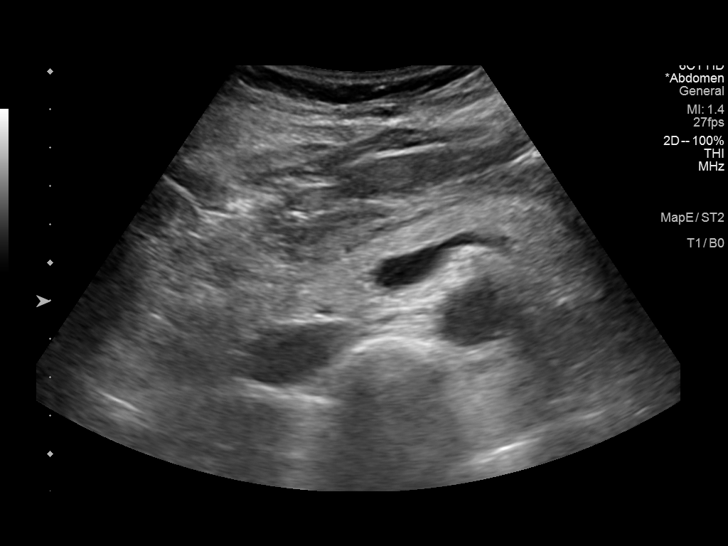
[im 31/81]
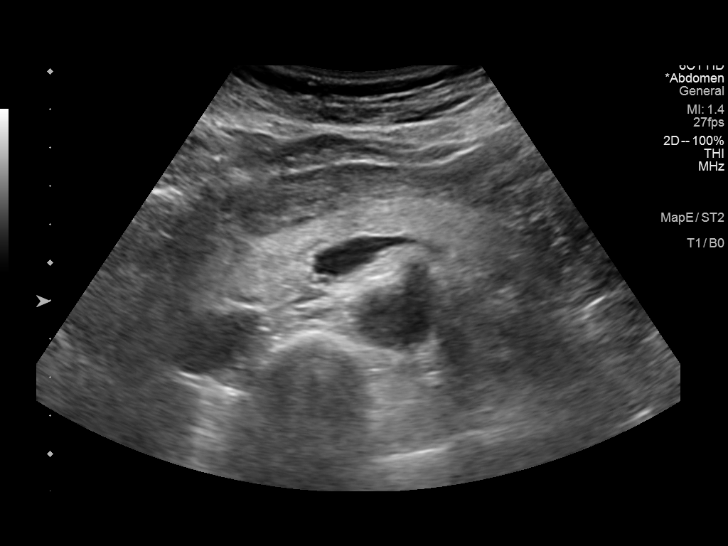
[im 37/81]
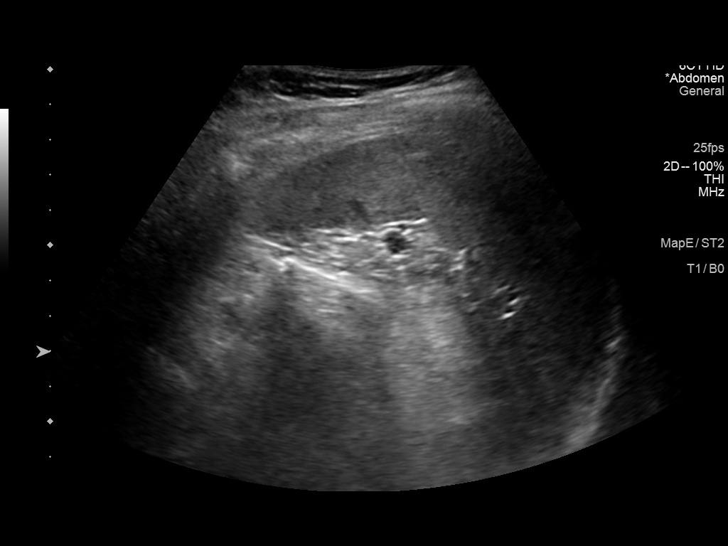
[im 44/81]
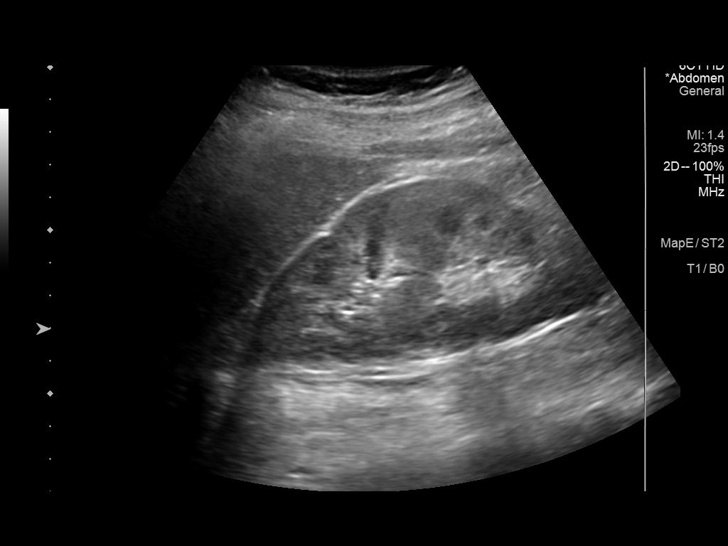
[im 51/81]
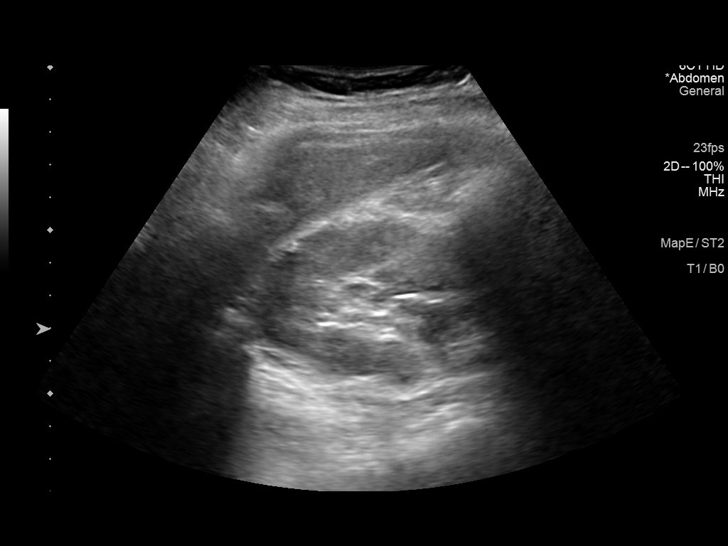
[im 54/81]
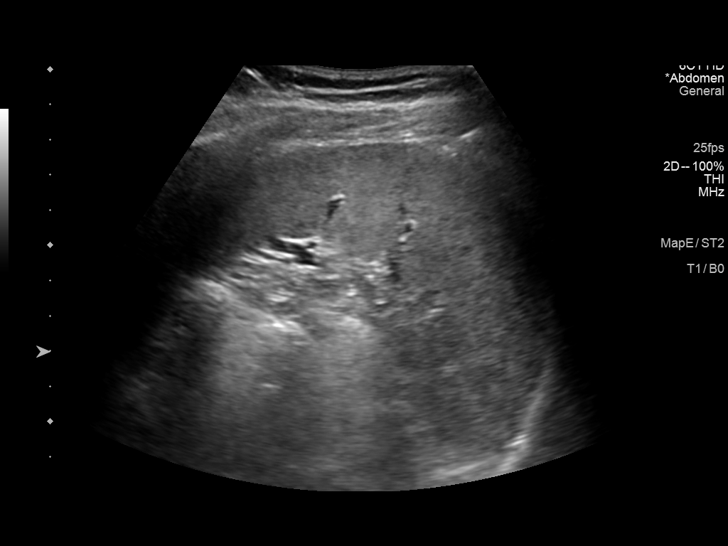
[im 61/81]
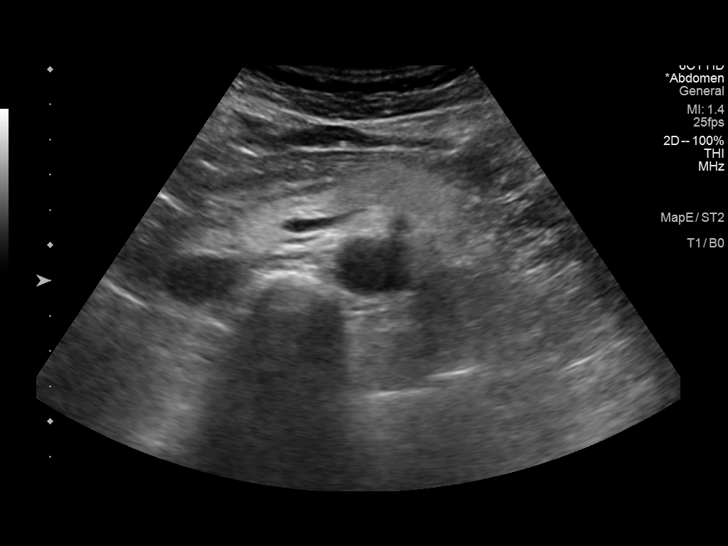
[im 67/81]
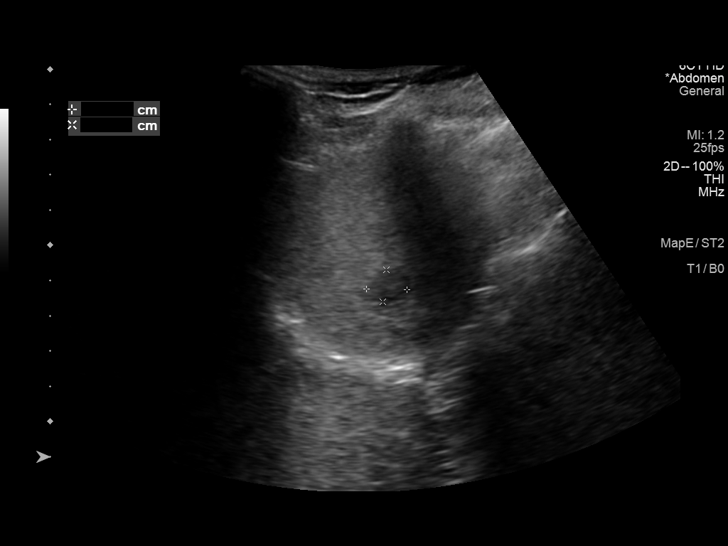
[im 74/81]
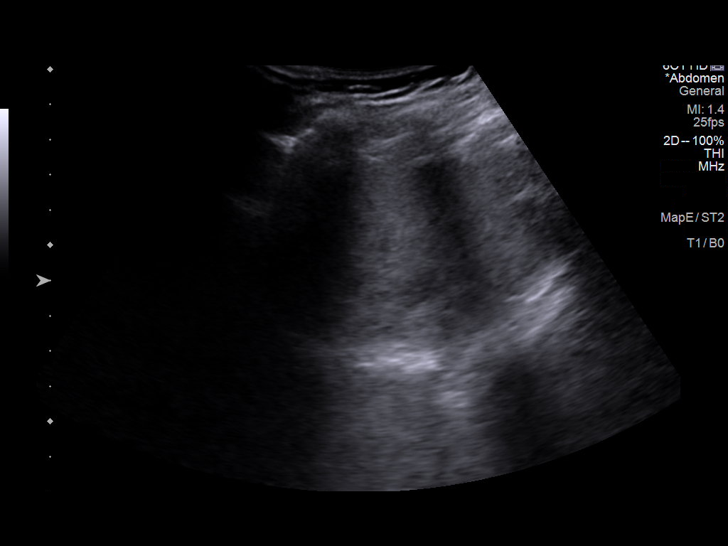
[im 81/81]
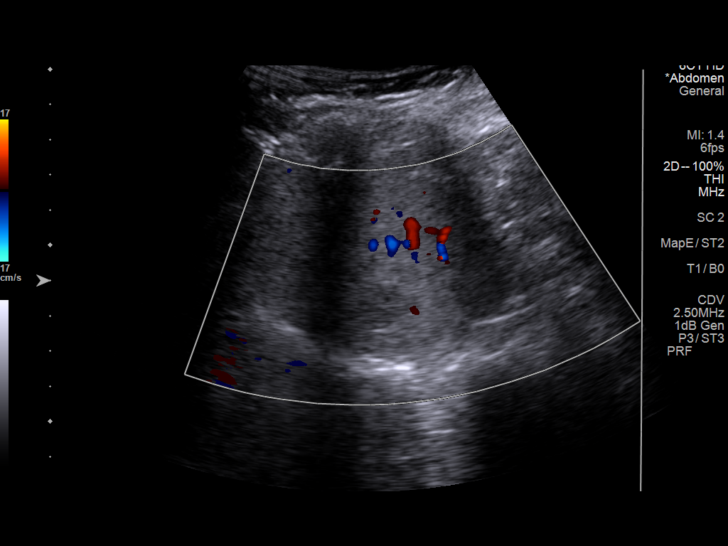

[14 of 25 positions shown; findings below may reference images not displayed]

FINDINGS: Gallbladder: Surgically absent

Common bile duct: Diameter: 3 mm

Liver: No focal lesion identified. Within normal limits in
parenchymal echogenicity. Portal vein is patent on color Doppler
imaging with normal direction of blood flow towards the liver.

IVC: No abnormality visualized.

Pancreas: Visualized portion unremarkable.

Spleen: Hypoechoic mass within the posterior spleen measuring 1.2 x
0.9 by 1 cm, previously 1.4 cm on CT.

Right Kidney: Length: 12.7 cm. Echogenicity within normal limits. No
mass or hydronephrosis visualized.

Left Kidney: Surgically absent

Abdominal aorta: No aneurysm visualized.

Other findings: None.
IMPRESSION: 1. Surgical absence of the gallbladder and left kidney.
2. 1.2 cm hypoechoic probably solid nodule in the posterior spleen,
grossly stable in size allowing for limitations of inter modality
comparison. Lesion remains indeterminate, consider follow-up MRI in
6 months.

## 2019-12-11 ENCOUNTER — Other Ambulatory Visit: Payer: Self-pay | Admitting: Internal Medicine

## 2019-12-11 DIAGNOSIS — D7389 Other diseases of spleen: Secondary | ICD-10-CM

## 2020-01-02 ENCOUNTER — Ambulatory Visit
Admission: RE | Admit: 2020-01-02 | Discharge: 2020-01-02 | Disposition: A | Discharge status: Home / Self Care | Payer: 59 | Source: Ambulatory Visit | Attending: Internal Medicine | Admitting: Internal Medicine

## 2020-01-02 ENCOUNTER — Other Ambulatory Visit: Payer: Self-pay

## 2020-01-02 DIAGNOSIS — D7389 Other diseases of spleen: Secondary | ICD-10-CM

## 2020-01-02 IMAGING — MR MR ABDOMEN WO/W CM
17 series · 48 of 48 positions shown · IV contrast (multihance)
Comparison: None.

CLINICAL DATA: 49-year-old post nephrectomy. Question of splenic
lesion on recent sonogram

EXAM:
MRI ABDOMEN WITHOUT AND WITH CONTRAST
TECHNIQUE: Multiplanar multisequence MR imaging of the abdomen was performed
both before and after the administration of intravenous contrast.
CONTRAST:  12mL MULTIHANCE GADOBENATE DIMEGLUMINE 529 MG/ML IV SOLN

[Series 3: T2 · coronal · 5.0mm · 1.56mm/px · 1 of 30 slices shown (1 of 3)]
[im 1/30]
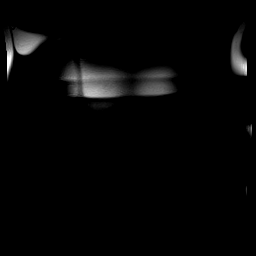

[Series 4: T1 · axial · 3.0mm · 1.19mm/px · z∈[-93,+120]mm · 5 of 144 slices shown]
[im 1/144]
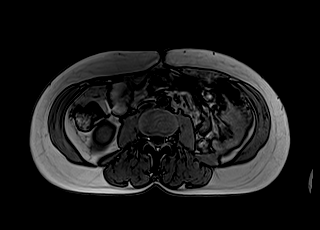
[im 36/144]
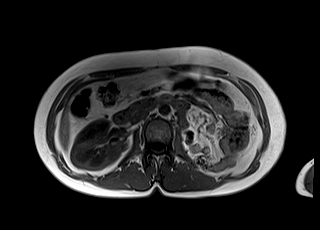
[im 72/144]
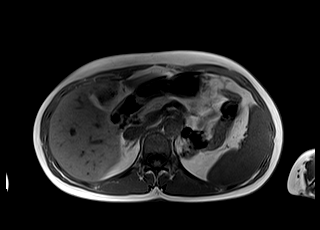
[im 108/144]
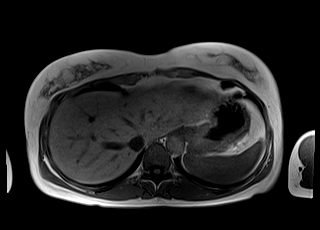
[im 144/144]
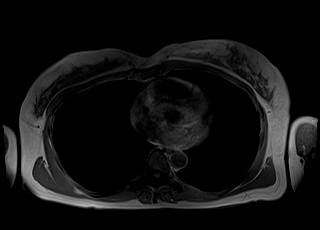

[Series 5: bSSFP · axial · 5.0mm · 1.25mm/px · z∈[-97,+124]mm · 2 of 38 slices shown]
[im 1/38]
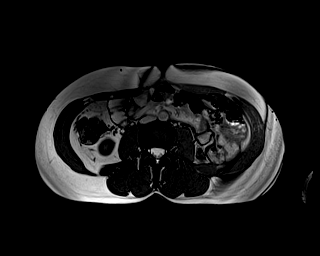
[im 38/38]
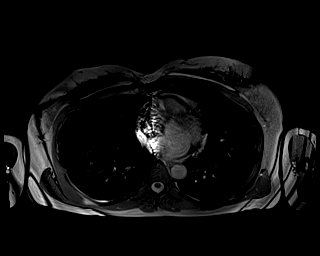

[Series 6: T2 · axial · 5.0mm · 1.48mm/px · z∈[-64,+158]mm · 2 of 38 slices shown (2 of 3)]
[im 1/38]
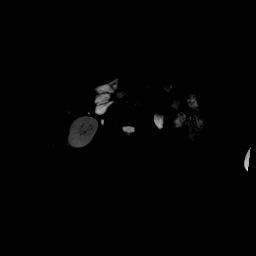
[im 38/38]
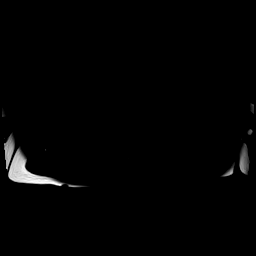

[Series 7: DWI · axial · 5.0mm · 1.42mm/px · z∈[-77,+133]mm · 5 of 108 slices shown (1 of 2)]
[im 1/108]
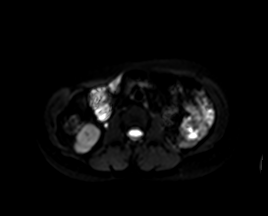
[im 27/108]
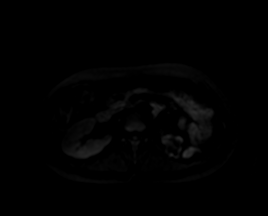
[im 54/108]
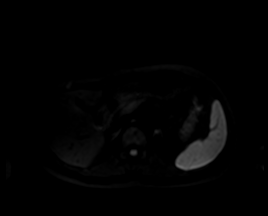
[im 81/108]
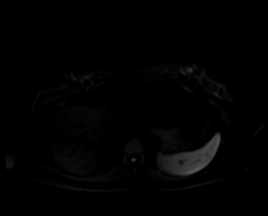
[im 108/108]
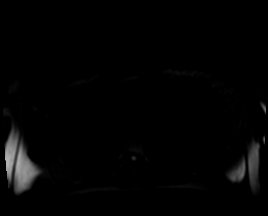

[Series 8: DWI · axial · 5.0mm · 1.42mm/px · z∈[-77,+133]mm · 2 of 36 slices shown (2 of 2)]
[im 1/36]
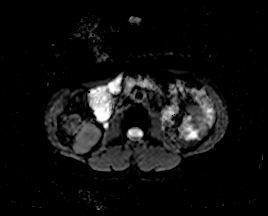
[im 36/36]
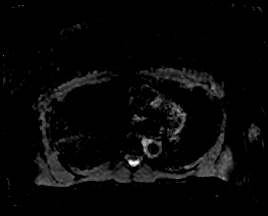

[Series 9: T2 · axial · 6.0mm · 1.19mm/px · 1 of 30 slices shown (3 of 3)]
[im 1/30]
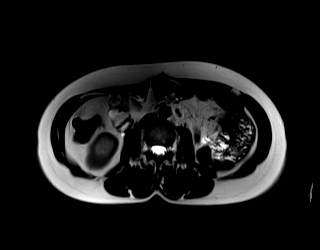

[Series 10: T1 dynamic · axial · non-contrast · 3.0mm · 1.25mm/px · z∈[-88,+125]mm · 3 of 72 slices shown]
[im 1/72]
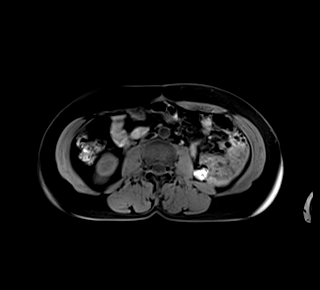
[im 36/72]
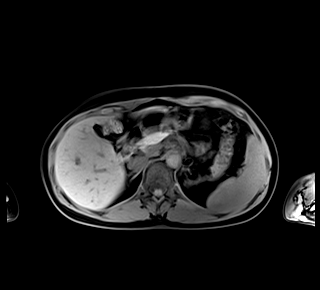
[im 72/72]
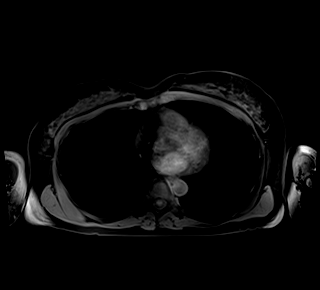

[Series 11: T1 dynamic post-contrast · axial · 3.0mm · 1.25mm/px · z∈[-88,+125]mm · 3 of 72 slices shown (1 of 9)]
[im 1/72]
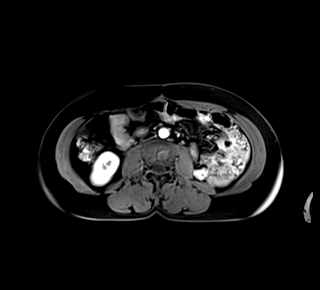
[im 36/72]
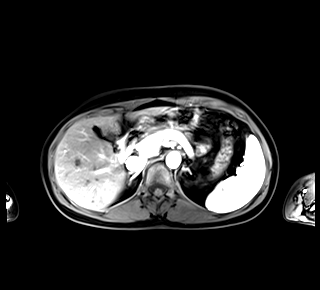
[im 72/72]
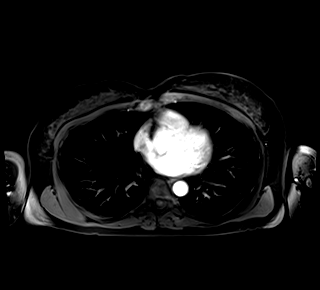

[Series 12: T1 dynamic post-contrast · axial · 3.0mm · 1.25mm/px · z∈[-88,+125]mm · 3 of 72 slices shown (2 of 9)]
[im 1/72]
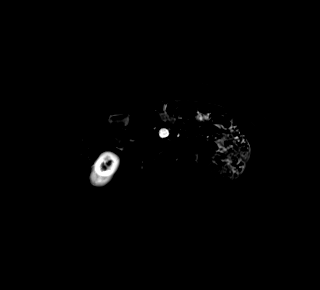
[im 36/72]
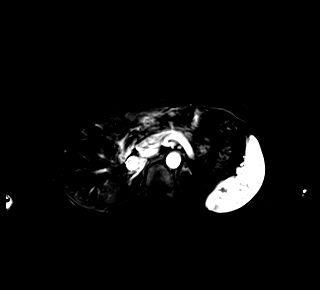
[im 72/72]
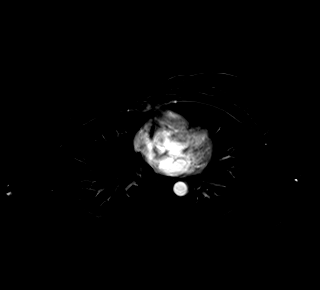

[Series 13: T1 dynamic post-contrast · axial · 3.0mm · 1.25mm/px · z∈[-88,+125]mm · 3 of 72 slices shown (3 of 9)]
[im 1/72]
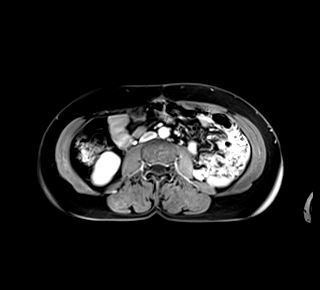
[im 36/72]
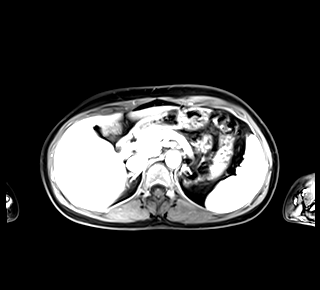
[im 72/72]
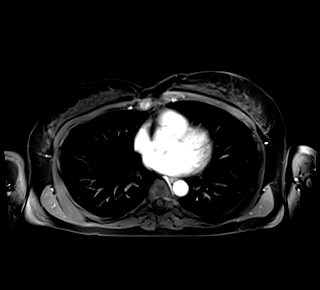

[Series 14: T1 dynamic post-contrast · axial · 3.0mm · 1.25mm/px · z∈[-88,+125]mm · 3 of 72 slices shown (4 of 9)]
[im 1/72]
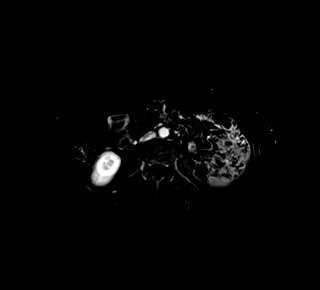
[im 36/72]
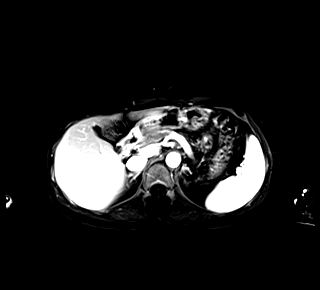
[im 72/72]
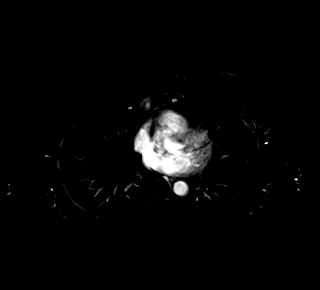

[Series 15: T1 dynamic post-contrast · axial · 3.0mm · 1.25mm/px · z∈[-88,+125]mm · 3 of 72 slices shown (5 of 9)]
[im 1/72]
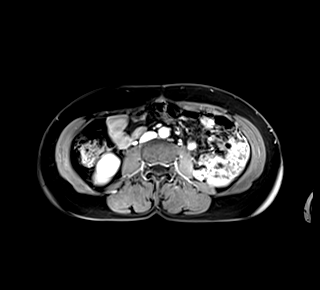
[im 36/72]
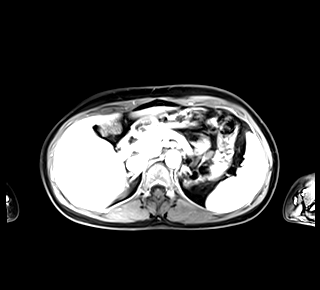
[im 72/72]
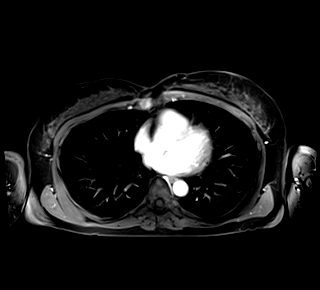

[Series 16: T1 dynamic post-contrast · axial · 3.0mm · 1.25mm/px · z∈[-88,+125]mm · 3 of 72 slices shown (6 of 9)]
[im 1/72]
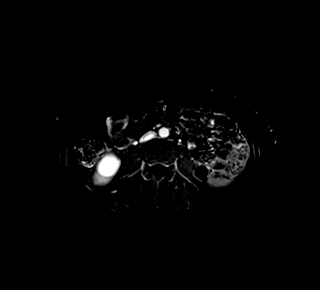
[im 36/72]
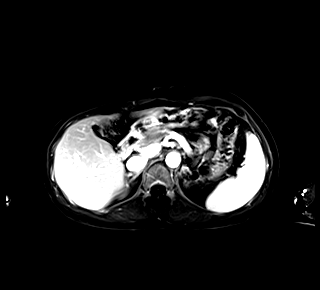
[im 72/72]
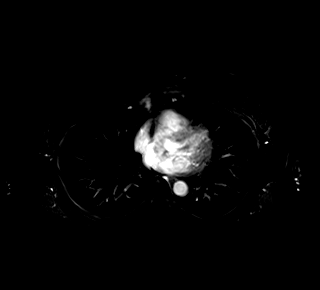

[Series 17: T1 dynamic post-contrast · coronal · 3.0mm · 1.25mm/px · 3 of 64 slices shown (7 of 9)]
[im 1/64]
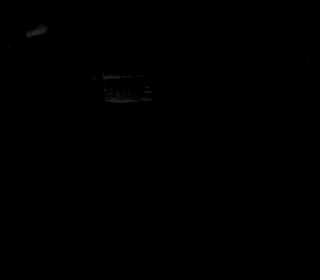
[im 32/64]
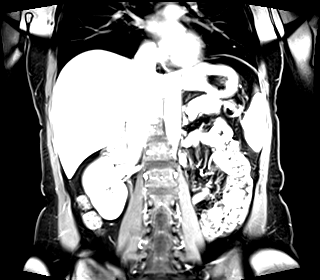
[im 64/64]
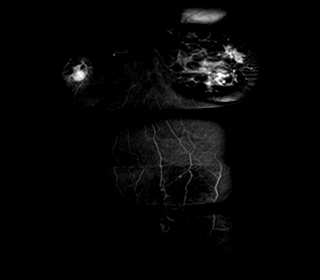

[Series 18: T1 dynamic post-contrast · axial · 3.0mm · 1.25mm/px · z∈[-88,+125]mm · 3 of 72 slices shown (8 of 9)]
[im 1/72]
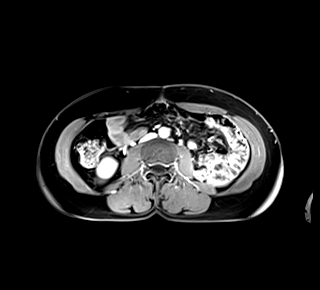
[im 36/72]
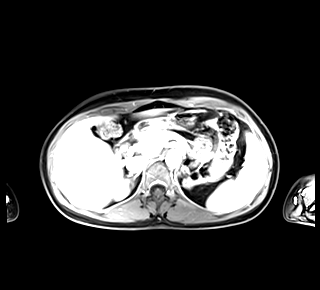
[im 72/72]
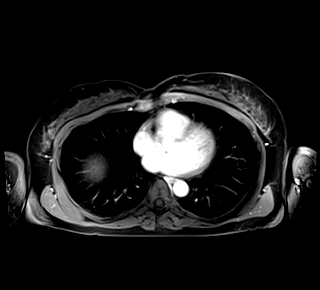

[Series 19: T1 dynamic post-contrast · axial · 3.0mm · 1.25mm/px · z∈[-88,+125]mm · 3 of 72 slices shown (9 of 9)]
[im 1/72]
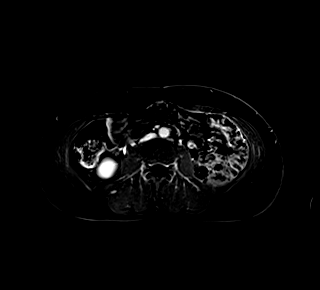
[im 36/72]
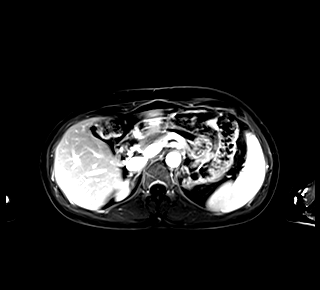
[im 72/72]
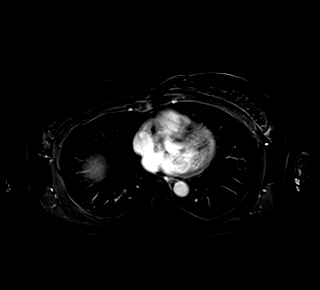

[48 of 48 positions shown; findings below may reference images not displayed]

FINDINGS: Lower chest: Incidental imaging of the lung bases is unremarkable by
MRI.

Hepatobiliary: No focal, suspicious hepatic lesion. The portal vein
is patent. Post cholecystectomy without biliary duct dilation.

Pancreas:  Normal

Spleen: Spleen with subtle area of abnormality in the posterior
spleen not seen on T2 and not seen on diffusion. Not evident on
precontrast T1 weighted images this is in the medial posterior
spleen and is isointense to spleen on delayed phase, best seen on
arterial and portal venous phases (image 30, series [DATE] x
cm.

Adrenals/Urinary Tract: Adrenal glands are normal. Post LEFT
nephrectomy. No sign of abnormal enhancement or suspicious finding
in the nephrectomy bed. No focal RIGHT renal lesion.

Stomach/Bowel: Visualized portions within the abdomen are
unremarkable.

Vascular/Lymphatic: Patent abdominal vasculature. There is no
gastrohepatic or hepatoduodenal ligament lymphadenopathy. No
retroperitoneal or mesenteric lymphadenopathy.

Other:  No ascites

Musculoskeletal: No suspicious bone lesions identified.
IMPRESSION: 1. Post LEFT nephrectomy. No sign of abnormal enhancement or
suspicious finding in the nephrectomy bed.
2. Focal splenic lesion very subtle and seen only on immediate
arterial phase and venous phase imaging. Findings are atypical for
hemangioma or splenic hamartoma but given isointensity on all but
these contrasted images would favor benign lesion with atypical
features. Consider 3 to six-month follow-up to assess for stability
with MRI. Nuclear medicine technetium sulfur colloid scan could also
be helpful to assess for any photopenic area that would indicate
other than splenic tissue in this location.

## 2020-01-02 MED ORDER — GADOBENATE DIMEGLUMINE 529 MG/ML IV SOLN
12.0000 mL | Freq: Once | INTRAVENOUS | Status: AC | PRN
  Administered 2020-01-02: 12 mL via INTRAVENOUS

## 2020-01-16 ENCOUNTER — Other Ambulatory Visit: Payer: Self-pay | Admitting: Internal Medicine

## 2020-01-16 DIAGNOSIS — D7389 Other diseases of spleen: Secondary | ICD-10-CM

## 2020-05-04 ENCOUNTER — Ambulatory Visit: Payer: Self-pay | Admitting: Surgery

## 2020-05-04 ENCOUNTER — Telehealth: Payer: Self-pay | Admitting: Hematology and Oncology

## 2020-05-04 DIAGNOSIS — C50912 Malignant neoplasm of unspecified site of left female breast: Secondary | ICD-10-CM

## 2020-05-04 NOTE — H&P (Signed)
Stevan Born Appointment: 05/04/2020 3:30 PM Location: [facility] Patient #: [id number] DOB: February 27, 1971 Married / Language: Cleophus Molt / Race: White Female  History of Present Illness Marcello Moores A. Sheryn Aldaz MD; 05/04/2020 4:19 PM) Patient words: Patient sent at the request of Dr. Deforest Hoyles for abnormal left breast mammogram. She was noted to have a cluster of microcalcifications left breast lower inner quadrant measuring 1.3 cm. 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 She has a family history of breast cancer including her mother and her aunt on her mother's side. No history of breast pain, nipple discharge or mass.  The patient is a 50 year old female.   Problem List/Past Medical Sharyn Lull R. 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 Brooks, CMA; 05/04/2020 3:37 PM) Appendectomy Gallbladder Surgery - Laparoscopic Laparoscopic Inguinal Hernia Surgery Bilateral, Right. Nephrectomy Left. Shoulder Surgery Bilateral. Ventral / Umbilical Hernia Surgery Right. Breast Biopsy Left.  Diagnostic Studies History Sharyn Lull R. Rolena Infante, CMA; 05/04/2020 3:37 PM) Colonoscopy within last year, 1-5 years ago Mammogram within last year Pap Smear 1-5 years ago  Allergies Sharyn Lull R. Brooks, CMA; 05/04/2020 3:37 PM) Anesthesia S/I-40 *GENERAL ANESTHETICS* Vomiting, Nausea.  Medication History Sharyn Lull R. Brooks, CMA; 05/04/2020 3:37 PM) Dexilant (30MG Capsule DR, Oral) Active. No Current Medications (Taken starting 05/04/2020) Medications Reconciled  Social History Sharyn Lull R. Brooks, CMA; 05/04/2020 3:37 PM) Alcohol use Occasional alcohol  use. Caffeine use Coffee, Tea. No drug use Tobacco use Never smoker.  Family History Sharyn Lull R. Rolena Infante, CMA; 05/04/2020 3:37 PM) Breast Cancer Family Members In General, Mother. Cancer Mother. Kidney Disease Father, Sister. Melanoma Father. Migraine Headache Sister.  Pregnancy / Birth History Sharyn Lull R. Rolena Infante, CMA; 05/04/2020 3:37 PM) Age at menarche 63 years. Gravida 1 Irregular periods Length (months) of breastfeeding 3-6, 7-12 Maternal age 51-30 Para 1  Other Problems Sharyn Lull R. Brooks, CMA; 05/04/2020 3:37 PM) Gastroesophageal Reflux Disease Migraine Headache Ventral Hernia Repair Breast Cancer Gastric Ulcer General anesthesia - complications Umbilical Hernia Repair     Review of Systems Sharyn Lull R. Brooks CMA; 05/04/2020 3:37 PM) General Not Present- Appetite Loss, Chills, Fatigue, Fever, Night Sweats, Weight Gain and Weight Loss. Skin Not Present- Change in Wart/Mole, Dryness, Hives, Jaundice, New Lesions, Non-Healing Wounds, Rash and Ulcer. HEENT Present- Seasonal Allergies and Wears glasses/contact lenses. Not Present- Earache, Hearing Loss, Hoarseness, Nose Bleed, Oral Ulcers, Ringing in the Ears, Sinus Pain, Sore Throat, Visual Disturbances and Yellow Eyes. Respiratory Not Present- Bloody sputum, Chronic Cough, Difficulty Breathing, Snoring and Wheezing. Breast Present- Breast Pain. Not Present- Breast Mass, Nipple Discharge and Skin Changes. Cardiovascular Not Present- Chest Pain, Difficulty Breathing Lying Down, Leg Cramps, Palpitations, Rapid Heart Rate, Shortness of Breath and Swelling of Extremities. Gastrointestinal Not Present- Abdominal Pain, Bloating, Bloody Stool, Change in Bowel Habits, Chronic diarrhea, Constipation, Difficulty Swallowing, Excessive gas, Gets full quickly at meals, Hemorrhoids, Indigestion, Nausea, Rectal Pain and Vomiting. Female Genitourinary Not Present- Frequency, Nocturia, Painful Urination, Pelvic Pain and  Urgency. Musculoskeletal Not Present- Back Pain, Joint Pain, Joint Stiffness, Muscle Pain, Muscle Weakness and Swelling of Extremities. Neurological Not Present- Decreased Memory, Fainting, Headaches, Numbness, Seizures, Tingling, Tremor, Trouble walking and Weakness. Psychiatric Not Present- Anxiety, Bipolar, Change in Sleep Pattern, Depression, Fearful and Frequent crying. Endocrine Not Present- Cold Intolerance, Excessive Hunger, Hair Changes,  Heat Intolerance, Hot flashes and New Diabetes. Hematology Not Present- Blood Thinners, Easy Bruising, Excessive bleeding, Gland problems, HIV and Persistent Infections.  Vitals [employer] R. Brooks CMA; 05/04/2020 3:37 PM) 05/04/2020 3:36 PM Weight: 137.13 lb Height: 66in Body Surface Area: 1.7 m Body Mass Index: 22.13 kg/m  Pulse: 105 (Regular)         Physical Exam (Brantlee Hinde A. Sheridyn Canino MD; 05/04/2020 4:20 PM)  General Mental Status-Alert. General Appearance-Consistent with stated age. Hydration-Well hydrated. Voice-Normal.  Head and Neck Head-normocephalic, atraumatic with no lesions or palpable masses. Trachea-midline. Thyroid Gland Characteristics - normal size and consistency.  Chest and Lung Exam Chest and lung exam reveals -quiet, even and easy respiratory effort with no use of accessory muscles and on auscultation, normal breath sounds, no adventitious sounds and normal vocal resonance. Inspection Chest Wall - Normal. Back - normal.  Breast Breast - Left-Symmetric, Non Tender, No Biopsy scars, no Dimpling - Left, No Inflammation, No Lumpectomy scars, No Mastectomy scars, No Peau d' Orange. Breast - Right-Symmetric, Non Tender, No Biopsy scars, no Dimpling - Right, No Inflammation, No Lumpectomy scars, No Mastectomy scars, No Peau d' Orange. Breast Lump-No Palpable Breast Mass.  Cardiovascular Cardiovascular examination reveals -normal heart sounds, regular rate and rhythm with no murmurs and normal  pedal pulses bilaterally.  Neurologic Neurologic evaluation reveals -alert and oriented x 3 with no impairment of recent or remote memory. Mental Status-Normal.  Lymphatic Head & Neck  General Head & Neck Lymphatics: Bilateral - Description - Normal. Axillary  General Axillary Region: Bilateral - Description - Normal. Tenderness - Non Tender.    Assessment & Plan (Bo Teicher A. Lilyrose Tanney MD; 05/04/2020 4:20 PM)  BREAST CANCER, STAGE 1, LEFT (C50.912) Impression: Discussed breast conserving surgery versus mastectomy with reconstruction. Given strong family history she is opted for bilateral nipple preserving mastectomies with left axillary sentinel lymph node mapping. She is HER-2 positive and may require chemotherapy so I discussed port placement at the same time. Referral to plastic surgery. Coordinate surgery when able. She is seeing medical oncology tomorrow. Discussed treatment options for breast cancer to include breast conservation vs mastectomy with reconstruction. Pt has decided on mastectomy. Risk include bleeding, infection, flap necrosis, pain, nipple loss cosmetic deformity numbness, recurrence, hematoma, other surgery needs. Pt understands and agrees to proceed.  total time 45 minutes  Current Plans Pt Education - [facility] Portacath HCI Use of a central venous catheter for intravenous therapy was discussed. Technique of catheter placement using ultrasound and fluoroscopy guidance was discussed. Risks such as bleeding, infection, pneumothorax, catheter occlusion, reoperation, and other risks were discussed. I noted a good likelihood this will help address the problem. Questions were answered. The patient expressed understanding & wishes to proceed.  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  You should hear from our office's scheduling department within 5  working days about the location, date, and time of surgery. We try to make accommodations for patient's preferences in scheduling surgery, but sometimes the OR schedule or the surgeon's schedule prevents [place] from making those accommodations.  If you have not heard from our office [phone]) in 5 working days, call the office and ask for your surgeon's nurse.  If you have other questions about your diagnosis, plan, or surgery, call the office and ask for your surgeon's nurse.  Pt Education - [facility] Breast Cancer Information Given - [employer] "Breast Journey" Package Pt Education -  [facility] Mastectomy HCI Pt Education - ABC (After Breast Cancer) Class Info: discussed with patient and provided information.

## 2020-05-04 NOTE — Telephone Encounter (Signed)
Received a new pt referral from Dr. Lysle Rubens for a new dx of breast cancer. April Yoder has been cld and scheduled to see April Yoder on 3/8 at 230pm. Pt aware to arrive 20 minutes early.

## 2020-05-04 NOTE — Progress Notes (Signed)
[facility] CONSULT NOTE  Patient Care Team: Wenda Low, MD as PCP - General (Internal Medicine) Pieter Partridge, DO as Consulting Physician (Neurology)  CHIEF COMPLAINTS/PURPOSE OF CONSULTATION:  Newly diagnosed breast cancer  HISTORY OF PRESENTING ILLNESS:  April Yoder 50 y.o. female is here because of recent diagnosis of invasive ductal carcinoma of the left breast. Screening mammogram on 04/21/20 detected a left breast lesion. Diagnostic mammogram and [place] on 04/28/20 showed left breast calcifications at the 6-7 o'clock position, 1.3cm. Biopsy on 04/29/20 showed invasive ductal carcinoma, grade 3, ER/PR negative, HER-2 positive (3+).She presents to the clinic today for initial evaluation and discussion of treatment options.   I reviewed her records extensively and collaborated the history with the patient.  SUMMARY OF ONCOLOGIC HISTORY: Oncology History  Malignant neoplasm of lower-inner quadrant of left breast in female, estrogen receptor negative ([place])  04/29/2020 Initial Diagnosis   Screening mammogram on 04/21/20 detected a left breast lesion. Diagnostic mammogram and [place] on 04/28/20 showed left breast calcifications at the 6-7 o'clock position, 1.3cm. Biopsy on 04/29/20 showed invasive ductal carcinoma, grade 3, ER/PR negative, HER-2 positive (3+).   05/05/2020 Cancer Staging   Staging form: Breast, AJCC 8th Edition - Clinical stage from 05/05/2020: Stage IA (cT1c, cN0, cM0, G3, ER-, PR-, HER2+) - Signed by Nicholas Lose, MD on 05/05/2020 Stage prefix: Initial diagnosis     MEDICAL HISTORY:  No past medical history on file.  SURGICAL HISTORY: Past Surgical History:  Procedure Laterality Date  . APPENDECTOMY    . CHOLECYSTECTOMY    . KIDNEY SURGERY Left    left kidney removed  . SHOULDER ARTHROSCOPY Bilateral     SOCIAL HISTORY: Social History   Socioeconomic History  . Marital status: Married    Spouse name: Clare Gandy  . Number of children: 2  . Years of  education: Not on file  . Highest education level: Some college, no degree  Occupational History  . Occupation: retired  Tobacco Use  . Smoking status: Never Smoker  . Smokeless tobacco: Never Used  Substance and Sexual Activity  . Alcohol use: Yes    Comment: 2 x week  . Drug use: Never  . Sexual activity: Yes  Other Topics Concern  . Not on file  Social History Narrative   Patient is left-handed. She lives with her husband in a one level home. She drinks 2 cups of coffee a day and an occasional cup of herbal tea. Prior to [place] restrictions, she went to the gym 3 x a week. Recently she has been trying to ride her bike 3 x week.   Social Determinants of Health   Financial Resource Strain: Not on file  Food Insecurity: Not on file  Transportation Needs: Not on file  Physical Activity: Not on file  Stress: Not on file  Social Connections: Not on file  Intimate Partner Violence: Not on file    FAMILY HISTORY: Family History  Problem Relation Age of Onset  . Breast cancer Mother 49  . Polycystic kidney disease Father   . Polycystic kidney disease Sister     ALLERGIES:  has no allergies on file.  MEDICATIONS:  Current Outpatient Medications  Medication Sig Dispense Refill  . ALPRAZolam (XANAX) 0.25 MG tablet Take 0.25 mg by mouth daily as needed.    Marland Kitchen DEXILANT 60 MG capsule Take 1 capsule by mouth daily.    Marland Kitchen loratadine (CLARITIN) 10 MG tablet Take 10 mg by mouth daily.    Marland Kitchen  Multiple Vitamin (MULTIVITAMIN) tablet Take 1 tablet by mouth daily.    . rizatriptan (MAXALT) 10 MG tablet Take 1 tablet earliest onset of migraine.  May repeat in 2 hours if needed.  Maximum 2 tablets in 24 hrs. 10 tablet 0   No current facility-administered medications for this visit.    REVIEW OF SYSTEMS:   Constitutional: Denies fevers, chills or abnormal night sweats   All other systems were reviewed with the patient and are negative.  PHYSICAL EXAMINATION: ECOG PERFORMANCE STATUS: 0 -  Asymptomatic  Vitals:   05/05/20 1450  BP: 122/81  Pulse: 87  Resp: 20  Temp: 97.9 F (36.6 C)  SpO2: 100%   Filed Weights   05/05/20 1450  Weight: 138 lb 9.6 oz (62.9 kg)     RADIOGRAPHIC STUDIES: I have personally reviewed the radiological reports and agreed with the findings in the report.  ASSESSMENT AND PLAN:  Malignant neoplasm of lower-inner quadrant of left breast in female, estrogen receptor negative ([place]) Screening mammogram on 04/21/20 detected a left breast lesion. Diagnostic mammogram and [place] on 04/28/20 showed left breast calcifications at the 6-7 o'clock position, 1.3cm. Biopsy on 04/29/20 showed invasive ductal carcinoma, grade 3, ER/PR negative, HER-2 positive (3+). T1CN0 stage Ia clinical stage  Pathology and radiology counseling: Discussed with the patient, the details of pathology including the type of breast cancer,the clinical staging, the significance of ER, PR and HER-2/neu receptors and the implications for treatment. After reviewing the pathology in detail, we proceeded to discuss the different treatment options between surgery, radiation, chemotherapy, antiestrogen therapies.  Treatment plan: 1.  Patient wishes to undergo bilateral mastectomies with sentinel lymph node biopsy 2. adjuvant chemotherapy with Taxol Herceptin followed by Herceptin maintenance for 1 year    Chemo counseling: I discussed risks and benefits of Taxol and Herceptin including risk of neuropathy, cytopenias, Herceptin-related cardiac toxicities and GI symptoms.  Acute and long-term toxicities were also discussed with the patient    [facility] is [facility] to [facility] a second opinion. Return to clinic after surgery to discuss final pathology and confirm the treatment plan.   All questions were answered. The patient knows to call the clinic with any problems, questions or concerns.   Rulon Eisenmenger, MD, MPH 05/05/2020    I, Molly Dorshimer, am acting as scribe for Nicholas Lose, MD.  I have  reviewed the above documentation for accuracy and completeness, and I agree with the above.

## 2020-05-05 ENCOUNTER — Inpatient Hospital Stay: Payer: 59 | Attending: Hematology and Oncology | Admitting: Hematology and Oncology

## 2020-05-05 ENCOUNTER — Other Ambulatory Visit: Payer: Self-pay

## 2020-05-05 DIAGNOSIS — C50312 Malignant neoplasm of lower-inner quadrant of left female breast: Secondary | ICD-10-CM | POA: Diagnosis present

## 2020-05-05 DIAGNOSIS — Z171 Estrogen receptor negative status [ER-]: Secondary | ICD-10-CM | POA: Diagnosis not present

## 2020-05-05 DIAGNOSIS — Z803 Family history of malignant neoplasm of breast: Secondary | ICD-10-CM | POA: Diagnosis not present

## 2020-05-05 DIAGNOSIS — Z79899 Other long term (current) drug therapy: Secondary | ICD-10-CM | POA: Diagnosis not present

## 2020-05-05 NOTE — Assessment & Plan Note (Addendum)
Screening mammogram on 04/21/20 detected a left breast lesion. Diagnostic mammogram and [place] on 04/28/20 showed left breast calcifications at the 6-7 o'clock position, 1.3cm. Biopsy on 04/29/20 showed invasive ductal carcinoma, grade 3, ER/PR negative, HER-2 positive (3+). T1CN0 stage Ia clinical stage  Pathology and radiology counseling: Discussed with the patient, the details of pathology including the type of breast cancer,the clinical staging, the significance of ER, PR and HER-2/neu receptors and the implications for treatment. After reviewing the pathology in detail, we proceeded to discuss the different treatment options between surgery, radiation, chemotherapy, antiestrogen therapies.  Treatment plan: 1.  Breast conserving surgery with sentinel lymph node biopsy 2. adjuvant chemotherapy with Taxol Herceptin followed by Herceptin maintenance for 1 year 3.  Adjuvant radiation therapy  Chemo counseling: I discussed risks and benefits of Taxol and Herceptin including risk of neuropathy, cytopenias, Herceptin-related cardiac toxicities and GI symptoms.  Acute and long-term toxicities were also discussed with the patient and she is agreeable to proceed.  Return to clinic after surgery to discuss final pathology and confirm the treatment plan.

## 2020-05-06 ENCOUNTER — Encounter: Payer: Self-pay | Admitting: *Deleted

## 2020-05-06 ENCOUNTER — Telehealth: Payer: Self-pay | Admitting: *Deleted

## 2020-05-06 NOTE — Telephone Encounter (Signed)
Spoke to pt concerning navigation resources and contact information. Denies questions or concerns regarding dx or treatment care plan. Encourage pt to call with needs received verbal understanding.

## 2020-05-18 ENCOUNTER — Telehealth: Payer: Self-pay | Admitting: *Deleted

## 2020-05-18 NOTE — Telephone Encounter (Signed)
Spoke to pt regarding tx location. Pt has decided to have tx in [place] at [place]. Informed pt to please reach out with any questions or needs if they should arise. Received verbal understanding.

## 2020-05-19 ENCOUNTER — Ambulatory Visit: Payer: 59 | Attending: Surgery | Admitting: Physical Therapy

## 2020-05-19 ENCOUNTER — Other Ambulatory Visit: Payer: Self-pay | Admitting: *Deleted

## 2020-05-19 ENCOUNTER — Encounter: Payer: Self-pay | Admitting: Physical Therapy

## 2020-05-19 ENCOUNTER — Other Ambulatory Visit: Payer: Self-pay

## 2020-05-19 DIAGNOSIS — C50912 Malignant neoplasm of unspecified site of left female breast: Secondary | ICD-10-CM | POA: Insufficient documentation

## 2020-05-19 DIAGNOSIS — Z171 Estrogen receptor negative status [ER-]: Secondary | ICD-10-CM

## 2020-05-19 DIAGNOSIS — C50312 Malignant neoplasm of lower-inner quadrant of left female breast: Secondary | ICD-10-CM

## 2020-05-19 DIAGNOSIS — R293 Abnormal posture: Secondary | ICD-10-CM | POA: Diagnosis not present

## 2020-05-19 NOTE — Therapy (Signed)
[place], [place], [address] Phone: [phone]   Fax:  [phone]  Physical Therapy Evaluation  Patient Details  Name: April Yoder MRN: [id number] Date of Birth: 04-02-1970 Referring Provider (PT): Alvino Chapel   Encounter Date: 05/19/2020   PT End of Session - 05/19/20 1541    Visit Number 1    Number of Visits 2    Date for PT Re-Evaluation 06/30/20    PT Start Time 1500    PT Stop Time 1540    PT Time Calculation (min) 40 min    Activity Tolerance Patient tolerated treatment well    Behavior During Therapy [facility] for tasks assessed/performed           History reviewed. No pertinent past medical history.  Past Surgical History:  Procedure Laterality Date  . APPENDECTOMY    . CHOLECYSTECTOMY    . KIDNEY SURGERY Left    left kidney removed  . SHOULDER ARTHROSCOPY Bilateral     There were no vitals filed for this visit.    Subjective Assessment - 05/19/20 1505    Subjective I have had surgery on both shoulders in the past. 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 I have had many issues with the left shoulder because I am left handed.    Pertinent History L breast cancer, ER/PR-Her2+, plan is for pt to under bilateral mastectomy and L SLNB on 06/03/20, will undergo chemo and herceptin treatments- unsure about radiation, pt has 1 kidney, pt has had appendectomy and gallbladder removal, hx of bilateral shoulder surgeries    Patient Stated Goals to gain info from provider    Currently in Pain? No/denies              [facility] PT Assessment - 05/19/20 0001      Assessment   Medical Diagnosis L breast cancer    Referring Provider (PT) Alvino Chapel    Onset Date/Surgical Date 06/03/20    Hand Dominance Left    Prior Therapy none      Precautions   Precautions Other (comment)    Precaution Comments active cancer, pt has 1 kidney      Restrictions   Weight Bearing Restrictions No       Balance Screen   Has the patient fallen in the past 6 months No    Has the patient had a decrease in activity level because of a fear of falling?  No    Is the patient reluctant to leave their home because of a fear of falling?  No      Home Ecologist residence    Living Arrangements Spouse/significant other    Available Help at Discharge Family    Type of [place]      Prior Function   Level of [place] Retired    Leisure pt exercises 3-4x/wk, cardio for 20 min, stretching, weights, tower [employer]      Cognition   Overall Cognitive Status Within Functional Limits for tasks assessed      Posture/Postural Control   Posture/Postural Control Postural limitations    Postural Limitations Rounded Shoulders;Forward head      AROM   Right Shoulder Extension 83 Degrees    Right Shoulder Flexion 158 Degrees    Right Shoulder ABduction 180 Degrees    Right Shoulder Internal Rotation 52 Degrees    Right Shoulder External Rotation 85 Degrees    Left Shoulder Extension 80  Degrees    Left Shoulder Flexion 160 Degrees    Left Shoulder ABduction 180 Degrees    Left Shoulder Internal Rotation 58 Degrees    Left Shoulder External Rotation 88 Degrees             LYMPHEDEMA/ONCOLOGY QUESTIONNAIRE - 05/19/20 0001      Type   Cancer Type L breast cancer      Surgeries   Mastectomy Date 06/03/20      Lymphedema Assessments   Lymphedema Assessments Upper extremities      Right Upper Extremity Lymphedema   15 cm Proximal to Olecranon Process 25.7 cm    Olecranon Process 23 cm    15 cm Proximal to Ulnar Styloid Process 21.8 cm    Just Proximal to Ulnar Styloid Process 14 cm    Across Hand at [employer] 18.5 cm    At [place] of 2nd Digit 5.8 cm      Left Upper Extremity Lymphedema   15 cm Proximal to Olecranon Process 26 cm    Olecranon Process 23.8 cm    15 cm Proximal to Ulnar Styloid Process 22.9 cm    Just Proximal  to Ulnar Styloid Process 14.7 cm    Across Hand at [employer] 18.8 cm    At [place] of 2nd Digit 6.2 cm           L-DEX FLOWSHEETS - 05/19/20 1500      L-DEX LYMPHEDEMA SCREENING   Measurement Type Unilateral    L-DEX MEASUREMENT EXTREMITY Upper Extremity    POSITION  Standing    DOMINANT SIDE Left    At Risk Side Left    BASELINE SCORE (UNILATERAL) 4.1               The patient was assessed using the L-Dex machine today to produce a lymphedema index baseline score. The patient will be reassessed on a regular basis (typically every 3 months) to obtain new L-Dex scores. If the score is > 6.5 points away from his/her baseline score indicating onset of subclinical lymphedema, it will be recommended to wear a compression garment for 4 weeks, 12 hours per day and then be reassessed. If the score continues to be > 6.5 points from baseline at reassessment, we will initiate lymphedema treatment. Assessing in this manner has a 95% rate of preventing clinically significant lymphedema.     Objective measurements completed on examination: See above findings.               PT Education - 05/19/20 1547    Education Details ABC class, post op shoulder ROM exercises, anatomy and physiology of lymphatic system, signs and symptoms of lymphedema, SOZO, lymphedema risk reduction practices    Person(s) Educated Patient    Methods Explanation;Handout    Comprehension Verbalized understanding               PT Long Term Goals - 05/19/20 1546      PT LONG TERM GOAL #1   Title Pt will return to baseline shoulder ROM and not demonstrate any signs or symptoms of lymphedema    Time 6    Period Weeks    Status New    Target Date 06/30/20           Breast Clinic Goals - 05/19/20 1546      Patient will be able to verbalize understanding of pertinent lymphedema risk reduction practices relevant to her diagnosis specifically related to skin care.  Time 1    Period Days     Status Achieved      Patient will be able to return demonstrate and/or verbalize understanding of the post-op home exercise program related to regaining shoulder range of motion.   Time 1    Period Days    Status Achieved      Patient will be able to verbalize understanding of the importance of attending the postoperative After Breast Cancer Class for further lymphedema risk reduction education and therapeutic exercise.   Time 1    Period Days    Status Achieved                 Plan - 05/19/20 1541    Clinical Impression Statement Pt was diagnosed with L breast cancer on 04/21/20. It is HER2+, ER-, PR-. She is undergoing a bilateral mastectomy with L SLNB on 06/03/20. She will require chemotherapy and possibly radiation depending on results of the lymph node biopsy. 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 Pt received PT for her L shoulder but the pain never went away despite full ROM and pt was told she had a failed surgery.  Pt was instructed in post op exercises and educated that if she has a mastectomy then not to begin exercises until a week after the last drain is removed. Baseline ROM and SOZO measurements taken today. She will benefit from a post op PT reassessment to determine needs and in every 3 months for additional L dex screening to detect subclinical lymphedema.    Stability/Clinical Decision Making Stable/Uncomplicated    Rehab Potential Good    PT Frequency --   eval and 1 f/u visit   PT Duration 6 weeks    PT Treatment/Interventions ADLs/Self Care Home Management;Patient/family education;Therapeutic exercise    PT Next Visit Plan reassess baselines    PT Home Exercise Plan post op breast exercises    Consulted and Agree with Plan of Care Patient           Patient will benefit from skilled therapeutic intervention in order to improve the following deficits and  impairments:  Postural dysfunction,Decreased knowledge of precautions  Visit Diagnosis: Abnormal posture  Malignant neoplasm of left breast in female, estrogen receptor negative, unspecified site of breast [facility])   Patient will follow up at outpatient cancer rehab 3-4 weeks following surgery.  If the patient requires physical therapy at that time, a specific plan will be dictated and sent to the referring physician for approval. The patient was educated today on appropriate basic range of motion exercises to begin post operatively and the importance of attending the After Breast Cancer class following surgery.  Patient was educated today on lymphedema risk reduction practices as it pertains to recommendations that will benefit the patient immediately following surgery.  She verbalized good understanding.     Problem List Patient Active Problem List   Diagnosis Date Noted  . Malignant neoplasm of lower-inner quadrant of left breast in female, estrogen receptor negative ([place]) 05/05/2020    Allyson Sabal [facility] 05/19/2020, 3:47 PM  [place], [place], [address] Phone: [phone]   Fax:  [phone]  Name: April Yoder MRN: [id number] Date of Birth: May 05, 1970  Manus Gunning, PT 05/19/20 3:48 PM

## 2020-05-27 ENCOUNTER — Encounter: Payer: 59 | Admitting: Genetic Counselor

## 2020-05-27 ENCOUNTER — Other Ambulatory Visit: Payer: 59

## 2020-06-24 ENCOUNTER — Ambulatory Visit: Payer: 59 | Admitting: Physical Therapy

## 2020-07-06 ENCOUNTER — Other Ambulatory Visit: Payer: Self-pay

## 2020-07-06 ENCOUNTER — Encounter: Payer: Self-pay | Admitting: Physical Therapy

## 2020-07-06 ENCOUNTER — Ambulatory Visit: Payer: 59 | Attending: Surgery | Admitting: Physical Therapy

## 2020-07-06 DIAGNOSIS — C50912 Malignant neoplasm of unspecified site of left female breast: Secondary | ICD-10-CM | POA: Diagnosis present

## 2020-07-06 DIAGNOSIS — R293 Abnormal posture: Secondary | ICD-10-CM

## 2020-07-06 DIAGNOSIS — Z171 Estrogen receptor negative status [ER-]: Secondary | ICD-10-CM | POA: Insufficient documentation

## 2020-07-06 DIAGNOSIS — M25612 Stiffness of left shoulder, not elsewhere classified: Secondary | ICD-10-CM | POA: Diagnosis not present

## 2020-07-06 DIAGNOSIS — M25611 Stiffness of right shoulder, not elsewhere classified: Secondary | ICD-10-CM | POA: Diagnosis present

## 2020-07-06 DIAGNOSIS — Z483 Aftercare following surgery for neoplasm: Secondary | ICD-10-CM | POA: Diagnosis present

## 2020-07-06 NOTE — Therapy (Signed)
[place], [place], [address] Phone: [phone]   Fax:  [phone]  Physical Therapy Treatment  Patient Details  Name: April Yoder MRN: [id number] Date of Birth: [birth year] Referring Provider (PT): Alvino Chapel   Encounter Date: 07/06/2020   PT End of Session - 07/06/20 0949    Visit Number 1    Number of Visits 5    Date for PT Re-Evaluation 08/03/20    PT Start Time 0853    PT Stop Time 0935    PT Time Calculation (min) 42 min    Activity Tolerance Patient tolerated treatment well    Behavior During Therapy [facility] for tasks assessed/performed           History reviewed. No pertinent past medical history.  Past Surgical History:  Procedure Laterality Date  . APPENDECTOMY    . CHOLECYSTECTOMY    . KIDNEY SURGERY Left    left kidney removed  . SHOULDER ARTHROSCOPY Bilateral     There were no vitals filed for this visit.   Subjective Assessment - 07/06/20 0855    Subjective Despite how bad it was I forced myself to do the exercises every day and I think it helped.    Pertinent History L breast cancer, ER/PR-Her2+, pt underwent bilateral mastectomy (radical mastectomy on the L) and L SLNB (1/1) on 06/03/20, currently undergoing chemo and herceptin treatments- will begin radiation after chemo, pt has 1 kidney, pt has had appendectomy and gallbladder removal, hx of bilateral shoulder surgeries    Patient Stated Goals to gain info from provider    Currently in Pain? No/denies    Multiple Pain Sites No              [facility] PT Assessment - 07/06/20 0001      Balance Screen   Has the patient fallen in the past 6 months No      Prior Function   Level of [place] Independent    Vocation Retired      Associate Professor   Overall Cognitive Status Within Functional Limits for tasks assessed      AROM   Right Shoulder Extension 85 Degrees    Right Shoulder Flexion 151 Degrees    Right  Shoulder ABduction 180 Degrees    Right Shoulder Internal Rotation 78 Degrees    Right Shoulder External Rotation 90 Degrees    Left Shoulder Extension 68 Degrees    Left Shoulder Flexion 163 Degrees    Left Shoulder ABduction 180 Degrees    Left Shoulder Internal Rotation 60 Degrees    Left Shoulder External Rotation 90 Degrees             LYMPHEDEMA/ONCOLOGY QUESTIONNAIRE - 07/06/20 0001      Right Upper Extremity Lymphedema   15 cm Proximal to Olecranon Process 25.5 cm    Olecranon Process 22.6 cm    15 cm Proximal to Ulnar Styloid Process 21 cm    Just Proximal to Ulnar Styloid Process 14 cm    Across Hand at [employer] 17 cm    At [place] of 2nd Digit 5.8 cm      Left Upper Extremity Lymphedema   15 cm Proximal to Olecranon Process 26.5 cm    Olecranon Process 23.5 cm    15 cm Proximal to Ulnar Styloid Process 22.5 cm    Just Proximal to Ulnar Styloid Process 14.5 cm    Across Hand at [employer] 18 cm  At [facility] of 2nd Digit 6.1 cm                      [facility] Adult PT Treatment/Exercise - 07/06/20 0001      Shoulder Exercises: Supine   Flexion AAROM;Both;10 reps   with dowel with 5 sec holds; pt returned therapist demo   ABduction AAROM;Both;10 reps   with dowel with 5 sec holds, pt returned therapist demo                      PT Long Term Goals - 07/06/20 0948      PT LONG TERM GOAL #1   Title Pt will return to baseline shoulder ROM and not demonstrate any signs or symptoms of lymphedema    Baseline 07/06/20- lacking baseline L shoulder extension    Time 6    Period Weeks    Status On-going      PT LONG TERM GOAL #2   Title Pt will report no tightness with end range bilateral shoulder ROM to allow her to return to PLOF.    Time 4    Period Weeks    Status New    Target Date 08/03/20      PT LONG TERM GOAL #3   Title Pt will be independent in a home exercise program for continued strengthening and stretching.    Time 4     Period Weeks    Status New    Target Date 08/03/20                 Plan - 07/06/20 9833    Clinical Impression Statement Pt reports to PT after undergoing a bilateral mastectomy and L SLNB (1/1) on 06/03/20. She reports she has been compliant with her post op breast exercises. She is currently undergoing chemotherapy and will begin radiation at completion of chemo. Overall pt's nearly back to baseline ROM except her left extension is still limited. She does have tightness at end range. Her scars are healing but her left nipple is still bleeding but pt reports her doctor is aware. Pt would benefit from skilled PT services 1x/wk for 4 weeks to help decrease end range tightness and to instruct pt in a long term stretching and strengthening exercise program and instruct pt in scar mobilization.    PT Frequency 1x / week    PT Duration 4 weeks    PT Treatment/Interventions ADLs/Self Care Home Management;Patient/family education;Therapeutic exercise;Manual techniques;Passive range of motion;Scar mobilization    PT Next Visit Plan PROM bilateral shoulders, pulleys, ball, give supine scap and do scar mobilization in scars completely healed    PT Home Exercise Plan post op breast exercises, supine dowel    Consulted and Agree with Plan of Care Patient           Patient will benefit from skilled therapeutic intervention in order to improve the following deficits and impairments:  Postural dysfunction,Increased fascial restricitons,Decreased strength,Decreased range of motion,Decreased scar mobility  Visit Diagnosis: Stiffness of left shoulder, not elsewhere classified  Stiffness of right shoulder, not elsewhere classified  Aftercare following surgery for neoplasm  Abnormal posture  Malignant neoplasm of left breast in female, estrogen receptor negative, unspecified site of breast [facility])     Problem List Patient Active Problem List   Diagnosis Date Noted  . Malignant neoplasm of  lower-inner quadrant of left breast in female, estrogen receptor negative ([place]) 05/05/2020    [place] 07/06/2020, 9:55  [place], [place], [address] Phone: [phone]   Fax:  [phone]  Name: April Yoder MRN: [id number] Date of Birth: 1971/01/29  Manus Gunning, PT 07/06/20 9:55 AM

## 2020-07-06 NOTE — Patient Instructions (Signed)
Shoulder: Flexion (Supine)    With hands shoulder width apart, slowly lower dowel to floor behind head. Do not let elbows bend. Keep back flat. Hold 5-30____ seconds. Repeat __10__ times. Do __1-2__ sessions per day. CAUTION: Stretch slowly and gently.  Copyright  VHI. All rights reserved.  Shoulder: Abduction (Supine)    With right arm flat on floor, hold dowel in palm. Slowly move arm up to side of head by pushing with opposite arm. Do not let elbow bend. Hold _5-30___ seconds. Repeat __10__ times. Do _1-2___ sessions per day. Repeat on opposite side.  CAUTION: Stretch slowly and gently.  Copyright  VHI. All rights reserved.

## 2020-07-15 ENCOUNTER — Other Ambulatory Visit: Payer: 59

## 2020-07-23 ENCOUNTER — Encounter: Payer: Self-pay | Admitting: Physical Therapy

## 2020-07-23 ENCOUNTER — Ambulatory Visit: Payer: 59 | Admitting: Neurology

## 2020-07-23 ENCOUNTER — Other Ambulatory Visit: Payer: Self-pay

## 2020-07-23 ENCOUNTER — Ambulatory Visit: Payer: 59 | Admitting: Physical Therapy

## 2020-07-23 DIAGNOSIS — M25612 Stiffness of left shoulder, not elsewhere classified: Secondary | ICD-10-CM | POA: Diagnosis not present

## 2020-07-23 DIAGNOSIS — Z483 Aftercare following surgery for neoplasm: Secondary | ICD-10-CM

## 2020-07-23 DIAGNOSIS — R293 Abnormal posture: Secondary | ICD-10-CM

## 2020-07-23 DIAGNOSIS — C50912 Malignant neoplasm of unspecified site of left female breast: Secondary | ICD-10-CM

## 2020-07-23 DIAGNOSIS — M25611 Stiffness of right shoulder, not elsewhere classified: Secondary | ICD-10-CM

## 2020-07-23 NOTE — Therapy (Signed)
[place], [place], [address] Phone: [phone]   Fax:  [phone]  Physical Therapy Treatment  Patient Details  Name: April Yoder MRN: [id number] Date of Birth: June 14, 1970 Referring Provider (PT): Alvino Chapel   Encounter Date: 07/23/2020   PT End of Session - 07/23/20 0954    Visit Number 2    Number of Visits 5    Date for PT Re-Evaluation 08/03/20    PT Start Time 0906    PT Stop Time 0950    PT Time Calculation (min) 44 min    Activity Tolerance Patient tolerated treatment well    Behavior During Therapy [facility] for tasks assessed/performed           History reviewed. No pertinent past medical history.  Past Surgical History:  Procedure Laterality Date  . APPENDECTOMY    . CHOLECYSTECTOMY    . KIDNEY SURGERY Left    left kidney removed  . SHOULDER ARTHROSCOPY Bilateral     There were no vitals filed for this visit.   Subjective Assessment - 07/23/20 0907    Subjective I have been my PT daily.    Pertinent History L breast cancer, ER/PR-Her2+, pt underwent bilateral mastectomy (radical mastectomy on the L) and L SLNB (1/1) on 06/03/20, currently undergoing chemo and herceptin treatments- will begin radiation after chemo, pt has 1 kidney, pt has had appendectomy and gallbladder removal, hx of bilateral shoulder surgeries    Patient Stated Goals to gain info from provider    Currently in Pain? No/denies              [facility] PT Assessment - 07/23/20 0001      AROM   Right Shoulder Extension 72 Degrees    Right Shoulder Flexion 165 Degrees    Left Shoulder Extension 75 Degrees    Left Shoulder Flexion 170 Degrees                         [facility] Adult PT Treatment/Exercise - 07/23/20 0001      Shoulder Exercises: Supine   Horizontal ABduction Strengthening;Both;10 reps   pt returned therapist demo   Theraband Level (Shoulder Horizontal ABduction) Level 1  (Yellow)    External Rotation Strengthening;Both;10 reps   pt returned therapist demo   Theraband Level (Shoulder External Rotation) Level 1 (Yellow)    Flexion Strengthening;Both;10 reps   narrow and wide grip, pt returned therapist demo, 5 sec holds   Theraband Level (Shoulder Flexion) Level 1 (Yellow)    Diagonals Strengthening;Both;10 reps   pt returned therapist demo   Theraband Level (Shoulder Diagonals) Level 1 (Yellow)      Shoulder Exercises: Pulleys   Flexion 2 minutes   pt returned therapist demo   ABduction 2 minutes   pt returned therapist demo     Shoulder Exercises: Therapy Ball   Flexion 10 reps;Both   pt returned therapist demo   ABduction 10 reps;Both   pt returned therapist demo     Manual Therapy   Manual therapy comments assessed pt's scar to see if she needed scar mobilization but pt reports she has been wearing silicone tape that her doctor gave her and her scars did not demosntrate any fibrosis and were flat with good mobility                       PT Long Term Goals - 07/06/20 3300  PT LONG TERM GOAL #1   Title Pt will return to baseline shoulder ROM and not demonstrate any signs or symptoms of lymphedema    Baseline 07/06/20- lacking baseline L shoulder extension    Time 6    Period Weeks    Status On-going      PT LONG TERM GOAL #2   Title Pt will report no tightness with end range bilateral shoulder ROM to allow her to return to PLOF.    Time 4    Period Weeks    Status New    Target Date 08/03/20      PT LONG TERM GOAL #3   Title Pt will be independent in a home exercise program for continued strengthening and stretching.    Time 4    Period Weeks    Status New    Target Date 08/03/20                 Plan - 07/23/20 0955    Clinical Impression Statement Pt fatigued from chemo and reports having high blood pressure which she has discussed with her doctor and was educated that it is normal with the chemo that she is  receiving. Pt required recovery periods in between exercises today. Pt instructed in supine scapular strengthening exercises today using yellow theraband. Began AAROM exercises including pulleys and ball up the wall. Assessed pt's scars today and they have great mobility with no signs of fibrosis.    PT Frequency 1x / week    PT Duration 4 weeks    PT Treatment/Interventions ADLs/Self Care Home Management;Patient/family education;Therapeutic exercise;Manual techniques;Passive range of motion;Scar mobilization    PT Next Visit Plan give strength ABC program, PROM bilateral shoulders, pulleys, ball, assess indep with supine scap    PT Home Exercise Plan post op breast exercises, supine dowel, supine dowel exercises    Consulted and Agree with Plan of Care Patient           Patient will benefit from skilled therapeutic intervention in order to improve the following deficits and impairments:  Postural dysfunction,Increased fascial restricitons,Decreased strength,Decreased range of motion,Decreased scar mobility  Visit Diagnosis: Stiffness of left shoulder, not elsewhere classified  Stiffness of right shoulder, not elsewhere classified  Aftercare following surgery for neoplasm  Abnormal posture  Malignant neoplasm of left breast in female, estrogen receptor negative, unspecified site of breast [facility])     Problem List Patient Active Problem List   Diagnosis Date Noted  . Malignant neoplasm of lower-inner quadrant of left breast in female, estrogen receptor negative ([place]) 05/05/2020    Allyson Sabal [facility] 07/23/2020, 9:58 AM  [place], [place], [address] Phone: [phone]   Fax:  [phone]  Name: TALITHA DICARLO MRN: [id number] Date of Birth: Nov 16, 1970  Manus Gunning, PT 07/23/20 9:58 AM

## 2020-07-23 NOTE — Patient Instructions (Signed)
Over Head Pull: Narrow and Wide Grip   Cancer Rehab [phone]   On back, knees bent, feet flat, band across thighs, elbows straight but relaxed. Pull hands apart (start). Keeping elbows straight, bring arms up and over head, hands toward floor. Keep pull steady on band. Hold momentarily. Return slowly, keeping pull steady, back to start. Then do same with a wider grip on the band (past shoulder width) Repeat _10__ times. Band color __yellow____   Side Pull: Double Arm   On back, knees bent, feet flat. Arms perpendicular to body, shoulder level, elbows straight but relaxed. Pull arms out to sides, elbows straight. Resistance band comes across collarbones, hands toward floor. Hold momentarily. Slowly return to starting position. Repeat _10__ times. Band color _yellow____   Sword   On back, knees bent, feet flat, left hand on left hip, right hand above left. Pull right arm DIAGONALLY (hip to shoulder) across chest. Bring right arm along head toward floor. Hold momentarily. Slowly return to starting position. *Thumb is pointed down when by hip and rotates upwards when by head* Repeat _10__ times. Do with left arm. Band color _yellow_____   Shoulder Rotation: Double Arm   On back, knees bent, feet flat, elbows tucked at sides, bent 90, hands palms up. Pull hands apart and down toward floor, keeping elbows near sides. Hold momentarily. Slowly return to starting position. Repeat _10__ times. Band color __yellow____

## 2020-07-30 ENCOUNTER — Ambulatory Visit: Payer: 59 | Attending: Surgery | Admitting: Physical Therapy

## 2020-07-30 ENCOUNTER — Other Ambulatory Visit: Payer: Self-pay

## 2020-07-30 ENCOUNTER — Encounter: Payer: Self-pay | Admitting: Physical Therapy

## 2020-07-30 DIAGNOSIS — Z171 Estrogen receptor negative status [ER-]: Secondary | ICD-10-CM | POA: Diagnosis present

## 2020-07-30 DIAGNOSIS — M25611 Stiffness of right shoulder, not elsewhere classified: Secondary | ICD-10-CM | POA: Insufficient documentation

## 2020-07-30 DIAGNOSIS — Z483 Aftercare following surgery for neoplasm: Secondary | ICD-10-CM | POA: Insufficient documentation

## 2020-07-30 DIAGNOSIS — C50912 Malignant neoplasm of unspecified site of left female breast: Secondary | ICD-10-CM | POA: Insufficient documentation

## 2020-07-30 DIAGNOSIS — R293 Abnormal posture: Secondary | ICD-10-CM | POA: Insufficient documentation

## 2020-07-30 DIAGNOSIS — M25612 Stiffness of left shoulder, not elsewhere classified: Secondary | ICD-10-CM | POA: Insufficient documentation

## 2020-07-30 NOTE — Therapy (Signed)
[place], [place], [address] Phone: [phone]   Fax:  [phone]  Physical Therapy Treatment  Patient Details  Name: LAKERA VIALL MRN: [id number] Date of Birth: Sep 26, 1970 Referring Provider (PT): Alvino Chapel   Encounter Date: 07/30/2020   PT End of Session - 07/30/20 1358    Visit Number 3    Number of Visits 5    Date for PT Re-Evaluation 08/03/20    PT Start Time 4580    PT Stop Time 1353    PT Time Calculation (min) 48 min    Activity Tolerance Patient tolerated treatment well    Behavior During Therapy [facility] for tasks assessed/performed           History reviewed. No pertinent past medical history.  Past Surgical History:  Procedure Laterality Date  . APPENDECTOMY    . CHOLECYSTECTOMY    . KIDNEY SURGERY Left    left kidney removed  . SHOULDER ARTHROSCOPY Bilateral     There were no vitals filed for this visit.   Subjective Assessment - 07/30/20 1307    Subjective The new exercises are going really good. I like the rubber band ones.    Pertinent History L breast cancer, ER/PR-Her2+, pt underwent bilateral mastectomy (radical mastectomy on the L) and L SLNB (1/1) on 06/03/20, currently undergoing chemo and herceptin treatments- will begin radiation after chemo, pt has 1 kidney, pt has had appendectomy and gallbladder removal, hx of bilateral shoulder surgeries    Patient Stated Goals to gain info from provider    Currently in Pain? No/denies              [facility] PT Assessment - 07/30/20 0001      AROM   Right Shoulder Extension 72 Degrees                         [facility] Adult PT Treatment/Exercise - 07/30/20 0001      Exercises   Exercises Other Exercises    Other Exercises  Instructed pt in entire Strength ABC program including all stetches and all strengthening exercises using 2 lb dumbells - all stretches held 30 seconds all exercises done x 10 reps -  pt required verbal cueing for exercises and occasional tactile cueing. Educated pt on proper way to progress exercises                       PT Long Term Goals - 07/06/20 0948      PT LONG TERM GOAL #1   Title Pt will return to baseline shoulder ROM and not demonstrate any signs or symptoms of lymphedema    Baseline 07/06/20- lacking baseline L shoulder extension    Time 6    Period Weeks    Status On-going      PT LONG TERM GOAL #2   Title Pt will report no tightness with end range bilateral shoulder ROM to allow her to return to PLOF.    Time 4    Period Weeks    Status New    Target Date 08/03/20      PT LONG TERM GOAL #3   Title Pt will be independent in a home exercise program for continued strengthening and stretching.    Time 4    Period Weeks    Status New    Target Date 08/03/20  Plan - 07/30/20 1359    Clinical Impression Statement Pt has been doing well with her exercises and is feeling better after chemo though she is having some neuropathy. Instruct pt in Strength ABC program today. All stretches were held 30 sec and exercises done with 2lb weights x 10 reps. Educated pt that she can break the program up and do half one day and half another day but to split the stretches and exercises between both days. Educated pt on how to progress exercises safely. Pt did not have any increased pain with exercises today and only required minimal verbal cues for correct form.    PT Frequency 1x / week    PT Duration 4 weeks    PT Treatment/Interventions ADLs/Self Care Home Management;Patient/family education;Therapeutic exercise;Manual techniques;Passive range of motion;Scar mobilization    PT Next Visit Plan assess indep with strength ABC program, is pt ready for d/c? PROM bilateral shoulders, pulleys, ball, assess indep with supine scap    PT Home Exercise Plan post op breast exercises, supine dowel, supine dowel exercises, Strength ABC program     Consulted and Agree with Plan of Care Patient           Patient will benefit from skilled therapeutic intervention in order to improve the following deficits and impairments:  Postural dysfunction,Increased fascial restricitons,Decreased strength,Decreased range of motion,Decreased scar mobility  Visit Diagnosis: Stiffness of left shoulder, not elsewhere classified  Stiffness of right shoulder, not elsewhere classified  Aftercare following surgery for neoplasm  Abnormal posture     Problem List Patient Active Problem List   Diagnosis Date Noted  . 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

## 2020-08-18 ENCOUNTER — Ambulatory Visit: Payer: 59

## 2020-08-18 ENCOUNTER — Other Ambulatory Visit: Payer: Self-pay

## 2020-08-18 DIAGNOSIS — M25611 Stiffness of right shoulder, not elsewhere classified: Secondary | ICD-10-CM

## 2020-08-18 DIAGNOSIS — M25612 Stiffness of left shoulder, not elsewhere classified: Secondary | ICD-10-CM

## 2020-08-18 DIAGNOSIS — Z483 Aftercare following surgery for neoplasm: Secondary | ICD-10-CM

## 2020-08-18 DIAGNOSIS — C50912 Malignant neoplasm of unspecified site of left female breast: Secondary | ICD-10-CM

## 2020-08-18 DIAGNOSIS — R293 Abnormal posture: Secondary | ICD-10-CM

## 2020-08-18 NOTE — Patient Instructions (Signed)
Access Code: [email or web address] URL: [email or web address] Date: 08/18/2020 Prepared by: Cheral Almas  Exercises Scapular Retraction with Resistance - 1 x daily - 3 x weekly - 1 sets - 10 reps Scapular Retraction with Resistance Advanced - 1 x daily - 3 x weekly - 1 sets - 10 reps Shoulder External Rotation and Scapular Retraction with Resistance - 1 x daily - 3 x weekly - 10 reps

## 2020-08-18 NOTE — Therapy (Signed)
[place], [place], [address] Phone: [phone]   Fax:  [phone]  Physical Therapy Treatment  Patient Details  Name: April Yoder MRN: [id number] Date of Birth: Jun 30, 1970 Referring Provider (PT): Alvino Chapel   Encounter Date: 08/18/2020   PT End of Session - 08/18/20 1212     Visit Number 4    Number of Visits 5    Date for PT Re-Evaluation 08/18/20    PT Start Time 1008    PT Stop Time 1055    PT Time Calculation (min) 47 min    Activity Tolerance Patient tolerated treatment well    Behavior During Therapy [facility] for tasks assessed/performed             History reviewed. No pertinent past medical history.  Past Surgical History:  Procedure Laterality Date   APPENDECTOMY     CHOLECYSTECTOMY     KIDNEY SURGERY Left    left kidney removed   SHOULDER ARTHROSCOPY Bilateral     There were no vitals filed for this visit.   Subjective Assessment - 08/18/20 1008     Subjective Working on ABC class but the last couple of weeks havent done as much because of chemo, but have done them about 5 times in 2 weeks. Pt feels like she has most of her motion back and she feels ready for discharge.    Pertinent History L breast cancer, ER/PR-Her2+, pt underwent bilateral mastectomy (radical mastectomy on the L) and L SLNB (1/1) on 06/03/20, currently undergoing chemo and herceptin treatments- will begin radiation after chemo, pt has 1 kidney, pt has had appendectomy and gallbladder removal, hx of bilateral shoulder surgeries    Patient Stated Goals to gain info from provider    Currently in Pain? No/denies    Multiple Pain Sites No                [facility] PT Assessment - 08/18/20 0001       AROM   Right Shoulder Extension 60 Degrees    Right Shoulder Flexion 165 Degrees    Right Shoulder ABduction 180 Degrees    Right Shoulder Internal Rotation 70 Degrees    Right Shoulder External  Rotation 98 Degrees    Left Shoulder Extension 70 Degrees    Left Shoulder Flexion 170 Degrees    Left Shoulder ABduction 180 Degrees    Left Shoulder Internal Rotation 68 Degrees    Left Shoulder External Rotation 94 Degrees               LYMPHEDEMA/ONCOLOGY QUESTIONNAIRE - 08/18/20 0001       Surgeries   Mastectomy Date 06/03/20      Right Upper Extremity Lymphedema   15 cm Proximal to Olecranon Process 25.6 cm    Olecranon Process 22.7 cm    15 cm Proximal to Ulnar Styloid Process 21.9 cm    Just Proximal to Ulnar Styloid Process 13.7 cm    At Base of 2nd Digit 5.9 cm      Left Upper Extremity Lymphedema   15 cm Proximal to Olecranon Process 25.6 cm    Olecranon Process 23.5 cm    15 cm Proximal to Ulnar Styloid Process 22.7 cm    Just Proximal to Ulnar Styloid Process 14.3 cm    At Base of 2nd Digit 6 cm  [facility] Adult PT Treatment/Exercise - 08/18/20 0001       Shoulder Exercises: Supine   Horizontal ABduction Strengthening;Both;12 reps    Theraband Level (Shoulder Horizontal ABduction) Level 1 (Yellow)    External Rotation Strengthening;Both;12 reps    Theraband Level (Shoulder External Rotation) Level 1 (Yellow)    Flexion Strengthening;Both;12 reps    Theraband Level (Shoulder Flexion) Level 1 (Yellow)    Diagonals Strengthening;Both;12 reps    Theraband Level (Shoulder Diagonals) Level 1 (Yellow)      Shoulder Exercises: Standing   External Rotation Strengthening;Both;10 reps    Theraband Level (Shoulder External Rotation) Level 1 (Yellow)    Extension Strengthening;Both;10 reps    Theraband Level (Shoulder Extension) Level 1 (Yellow)    Retraction Strengthening;Both;10 reps    Theraband Level (Shoulder Retraction) Level 1 (Yellow)      Shoulder Exercises: Pulleys   Flexion 2 minutes   pt returned therapist demo   ABduction 2 minutes                    PT Education - 08/18/20 1047     Education  Details Pt was instructed in standing scap. retraction, shoulder ext and bilateral ER.    Person(s) Educated Patient    Methods Demonstration;Handout    Comprehension Returned demonstration                 PT Long Term Goals - 08/18/20 1038       PT LONG TERM GOAL #1   Title Pt will return to baseline shoulder ROM and not demonstrate any signs or symptoms of lymphedema    Baseline lacking right shoulder extension    Time 6    Period Weeks    Status Partially Met      PT LONG TERM GOAL #2   Title Pt will report no tightness with end range bilateral shoulder ROM to allow her to return to PLOF.    Baseline feels slight tightness at end range but with functional ROM    Time 4    Period Weeks    Status Partially Met    Target Date 08/18/20      PT LONG TERM GOAL #3   Title Pt will be independent in a home exercise program for continued strengthening and stretching.    Time 4    Period Weeks    Status Achieved                   Plan - 08/18/20 1212     Clinical Impression Statement Pt has been compliant with her exercise program when she is feeling up to it and has done in 5 times in the last 2 weeks.  She feels good about her progression with the ABC program and feels ready for discharge today. She has achieved 1 goal and partially achieved 2 goals. She does still feel mild tightness at end ranges of motion.  Her shoulder ROM is progressing well but she was more limited today with right shoulder extension.  We reviewed her supine scapular series exercises and also added standing retraction, shoulder extension and bilateral ER to HEP.  She was given a red band to progress to and was advised to follow the rules for progression noted in her ABC handout. She knows to call if she experiences any tightness or difficulties during or after radiation.    Stability/Clinical Decision Making Stable/Uncomplicated    Rehab Potential Good    PT Frequency 1x / week  PT Duration 4  weeks    PT Treatment/Interventions ADLs/Self Care Home Management;Patient/family education;Therapeutic exercise;Manual techniques;Passive range of motion;Scar mobilization    PT Next Visit Plan DC to HEP    PT Home Exercise Plan post op breast exercises, supine dowel, supine dowel exercises, Strength ABC program    Consulted and Agree with Plan of Care Patient            PHYSICAL THERAPY DISCHARGE SUMMARY  Visits from Start of Care: 4  Current functional level related to goals / functional outcomes: Achieved 1 goal and partially achieved 2 goals   Remaining deficits: Mild end range tightness, Right shoulder extension limitation   Education / Equipment: Rae Halsted, ABC packet   Patient agrees to discharge. Patient goals were partially met. Patient is being discharged due to being pleased with the current functional level.  Patient will benefit from skilled therapeutic intervention in order to improve the following deficits and impairments:  Postural dysfunction, Increased fascial restricitons, Decreased strength, Decreased range of motion, Decreased scar mobility  Visit Diagnosis: Stiffness of left shoulder, not elsewhere classified  Stiffness of right shoulder, not elsewhere classified  Aftercare following surgery for neoplasm  Abnormal posture  Malignant neoplasm of left breast in female, estrogen receptor negative, unspecified site of breast [facility])     Problem List Patient Active Problem List   Diagnosis Date Noted   Malignant neoplasm of lower-inner quadrant of left breast in female, estrogen receptor negative ([place]) 05/05/2020    Claris Pong 08/18/2020, 12:19 PM  [place], [place], [address] Phone: [phone]   Fax:  [phone]  Name: April Yoder MRN: [id number] Date of Birth: Apr 24, 1970 Cheral Almas, PT 08/18/20 12:22 PM

## 2020-09-07 ENCOUNTER — Other Ambulatory Visit: Payer: Self-pay

## 2020-09-07 ENCOUNTER — Ambulatory Visit: Payer: 59 | Attending: Surgery

## 2020-09-07 DIAGNOSIS — Z483 Aftercare following surgery for neoplasm: Secondary | ICD-10-CM | POA: Insufficient documentation

## 2020-09-07 NOTE — Therapy (Signed)
[place], [place], [address] Phone: [phone]   Fax:  [phone]  Physical Therapy Treatment  Patient Details  Name: April Yoder MRN: [id number] Date of Birth: 07-08-1970 Referring Provider (PT): Alvino Chapel   Encounter Date: 09/07/2020   PT End of Session - 09/07/20 1013     Visit Number 4   # unchanged due to screen only   PT Start Time 1007    PT Stop Time 1013    PT Time Calculation (min) 6 min    Activity Tolerance Patient tolerated treatment well    Behavior During Therapy [facility] for tasks assessed/performed             No past medical history on file.  Past Surgical History:  Procedure Laterality Date   APPENDECTOMY     CHOLECYSTECTOMY     KIDNEY SURGERY Left    left kidney removed   SHOULDER ARTHROSCOPY Bilateral     There were no vitals filed for this visit.   Subjective Assessment - 09/07/20 1006     Subjective Pt returns for her 3 month L-Dex screen.    Pertinent History L breast cancer, ER/PR-Her2+, pt underwent bilateral mastectomy (radical mastectomy on the L) and L SLNB (1/1) on 06/03/20, currently undergoing chemo and herceptin treatments- will begin radiation after chemo, pt has 1 kidney, pt has had appendectomy and gallbladder removal, hx of bilateral shoulder surgeries                    L-DEX FLOWSHEETS - 09/07/20 1000       L-DEX LYMPHEDEMA SCREENING   Measurement Type Unilateral    L-DEX MEASUREMENT EXTREMITY Upper Extremity    POSITION  Standing    DOMINANT SIDE Left    At Risk Side Left    BASELINE SCORE (UNILATERAL) 4.1    L-DEX SCORE (UNILATERAL) 4.5    VALUE CHANGE (UNILAT) 0.4                                    PT Long Term Goals - 08/18/20 1038       PT LONG TERM GOAL #1   Title Pt will return to baseline shoulder ROM and not demonstrate any signs or symptoms of lymphedema    Baseline lacking  right shoulder extension    Time 6    Period Weeks    Status Partially Met      PT LONG TERM GOAL #2   Title Pt will report no tightness with end range bilateral shoulder ROM to allow her to return to PLOF.    Baseline feels slight tightness at end range but with functional ROM    Time 4    Period Weeks    Status Partially Met    Target Date 08/18/20      PT LONG TERM GOAL #3   Title Pt will be independent in a home exercise program for continued strengthening and stretching.    Time 4    Period Weeks    Status Achieved                   Plan - 09/07/20 1014     Clinical Impression Statement Pt returns for her 3 month L-Dex screen. Her change from baseline of 0.4 is WNLs so no futhter treatment is required at this time except to cont every 3 month  L-Dex screens which pt is agreeable to.    PT Next Visit Plan Cont every 3 month L-Dex screens for up to 2 years from her SLNB.    Consulted and Agree with Plan of Care Patient             Patient will benefit from skilled therapeutic intervention in order to improve the following deficits and impairments:     Visit Diagnosis: Aftercare following surgery for neoplasm     Problem List Patient Active Problem List   Diagnosis Date Noted   Malignant neoplasm of lower-inner quadrant of left breast in female, estrogen receptor negative ([place]) 05/05/2020    Otelia Limes, PTA 09/07/2020, 10:15 AM  [place], [place], [address] Phone: [phone]   Fax:  [phone]  Name: April Yoder MRN: [id number] Date of Birth: 24-Jul-1970

## 2020-12-07 ENCOUNTER — Other Ambulatory Visit: Payer: Self-pay

## 2020-12-07 ENCOUNTER — Ambulatory Visit: Payer: 59 | Attending: Surgery

## 2020-12-07 VITALS — Wt 128.5 lb

## 2020-12-07 DIAGNOSIS — Z483 Aftercare following surgery for neoplasm: Secondary | ICD-10-CM | POA: Insufficient documentation

## 2020-12-07 NOTE — Therapy (Signed)
[place] [facility] [place], [place], [address] Phone: [phone]   Fax:  [phone]  Physical Therapy Treatment  Patient Details  Name: April Yoder MRN: [id number] Date of Birth: 23-Aug-1970 Referring Provider (PT): Alvino Chapel   Encounter Date: 12/07/2020   PT End of Session - 12/07/20 1540     Visit Number 4   # unchanged due to screen only   PT Start Time 1538    PT Stop Time 1543    PT Time Calculation (min) 5 min    Activity Tolerance Patient tolerated treatment well    Behavior During Therapy [facility] for tasks assessed/performed             No past medical history on file.  Past Surgical History:  Procedure Laterality Date   APPENDECTOMY     CHOLECYSTECTOMY     KIDNEY SURGERY Left    left kidney removed   SHOULDER ARTHROSCOPY Bilateral     Vitals:   12/07/20 1539  Weight: 128 lb 8 oz (58.3 kg)     Subjective Assessment - 12/07/20 1539     Subjective Pt returns for her 3 month L-Dex screen.    Pertinent History L breast cancer, ER/PR-Her2+, pt underwent bilateral mastectomy (radical mastectomy on the L) and L SLNB (1/1) on 06/03/20, currently undergoing chemo and herceptin treatments- will begin radiation after chemo, pt has 1 kidney, pt has had appendectomy and gallbladder removal, hx of bilateral shoulder surgeries                    L-DEX FLOWSHEETS - 12/07/20 1500       L-DEX LYMPHEDEMA SCREENING   Measurement Type Unilateral    L-DEX MEASUREMENT EXTREMITY Upper Extremity    POSITION  Standing    DOMINANT SIDE Left    At Risk Side Left    BASELINE SCORE (UNILATERAL) 4.1    L-DEX SCORE (UNILATERAL) 2.6    VALUE CHANGE (UNILAT) -1.5                                     PT Long Term Goals - 08/18/20 1038       PT LONG TERM GOAL #1   Title Pt will return to baseline shoulder ROM and not demonstrate any signs or symptoms of  lymphedema    Baseline lacking right shoulder extension    Time 6    Period Weeks    Status Partially Met      PT LONG TERM GOAL #2   Title Pt will report no tightness with end range bilateral shoulder ROM to allow her to return to PLOF.    Baseline feels slight tightness at end range but with functional ROM    Time 4    Period Weeks    Status Partially Met    Target Date 08/18/20      PT LONG TERM GOAL #3   Title Pt will be independent in a home exercise program for continued strengthening and stretching.    Time 4    Period Weeks    Status Achieved                   Plan - 12/07/20 1544     Clinical Impression Statement Pt returns for her 3 month L-Dex screen. Her change from baseline of -1.5 is WNLs so no further  treatment is required at this time except to cont every 3 month L-Dex screens which pt is agreeable to.    PT Next Visit Plan Cont every 3 month L-Dex screens for up to 2 years from her SLNB (~06/04/22)    Consulted and Agree with Plan of Care Patient             Patient will benefit from skilled therapeutic intervention in order to improve the following deficits and impairments:     Visit Diagnosis: Aftercare following surgery for neoplasm     Problem List Patient Active Problem List   Diagnosis Date Noted   Malignant neoplasm of lower-inner quadrant of left breast in female, estrogen receptor negative ([place]) 05/05/2020    Otelia Limes, PTA 12/07/2020, 3:46 PM  [place] @ [place], [place], [address] Phone: [phone]   Fax:  [phone]  Name: April Yoder MRN: [id number] Date of Birth: Mar 03, 1970

## 2021-02-16 ENCOUNTER — Other Ambulatory Visit: Payer: Self-pay | Admitting: Urology

## 2021-02-16 DIAGNOSIS — N362 Urethral caruncle: Secondary | ICD-10-CM

## 2021-03-11 ENCOUNTER — Ambulatory Visit
Admission: RE | Admit: 2021-03-11 | Discharge: 2021-03-11 | Disposition: A | Discharge status: Home / Self Care | Payer: 59 | Source: Ambulatory Visit | Attending: Urology | Admitting: Urology

## 2021-03-11 DIAGNOSIS — N362 Urethral caruncle: Secondary | ICD-10-CM

## 2021-03-11 IMAGING — MR MR PELVIS WO/W CM
7 of 10 series · 30 of 48 positions shown · IV contrast (12 ML MULTIHANCE)
Comparison: None.

CLINICAL DATA: Urethral pain for 8 months. Suspect urethral
diverticulum.

EXAM:
MRI PELVIS WITHOUT AND WITH CONTRAST
TECHNIQUE: Multiplanar multisequence MR imaging of the pelvis was performed
both before and after administration of intravenous contrast.
CONTRAST:  12mL MULTIHANCE GADOBENATE DIMEGLUMINE 529 MG/ML IV SOLN

[Series 3: T2 · coronal · 3.0mm · 0.75mm/px · 4 of 35 slices shown (1 of 3)]
[im 1/35]
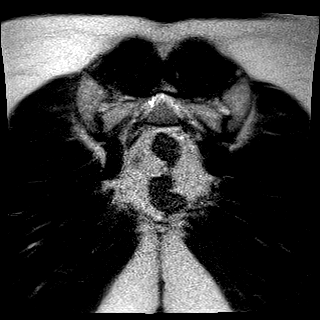
[im 12/35]
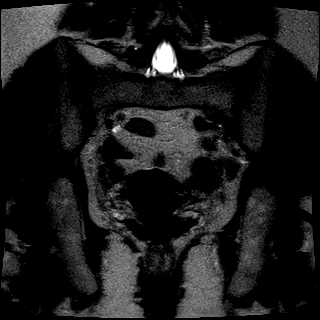
[im 23/35]
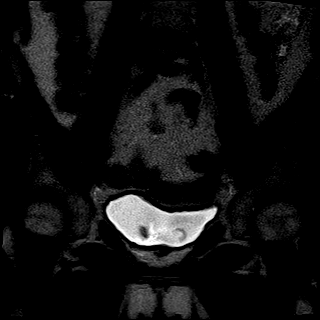
[im 35/35]
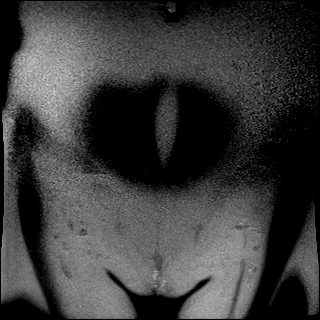

[Series 4: T2 · axial · 5.0mm · 0.94mm/px · z∈[-100,+62]mm · 4 of 28 slices shown (2 of 3)]
[im 1/28]
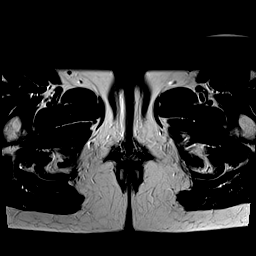
[im 10/28]
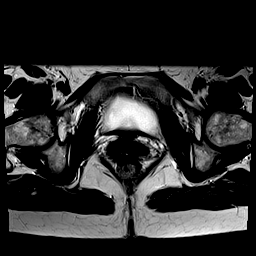
[im 19/28]
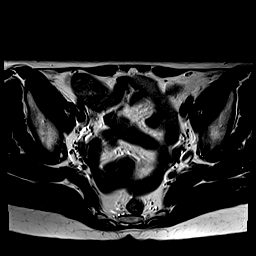
[im 28/28]
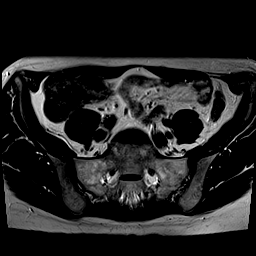

[Series 5: T2 fat-sat · axial · 5.0mm · 0.94mm/px · z∈[-100,+62]mm · 4 of 28 slices shown]
[im 1/28]
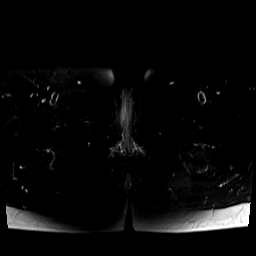
[im 10/28]
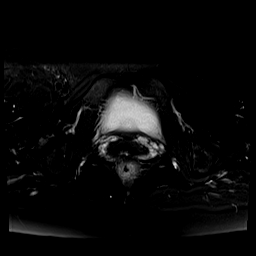
[im 19/28]
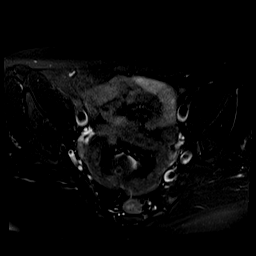
[im 28/28]
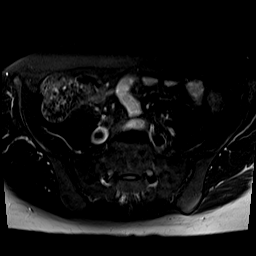

[Series 6: T2 · sagittal · 5.0mm · 0.94mm/px · 4 of 27 slices shown (3 of 3)]
[im 1/27]
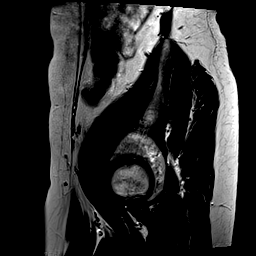
[im 9/27]
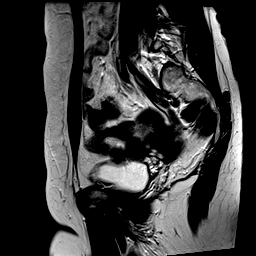
[im 18/27]
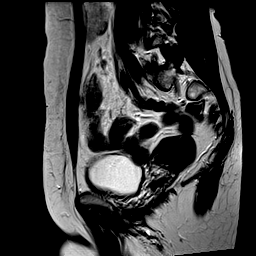
[im 27/27]
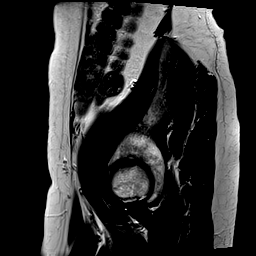

[Series 7: axial in out · axial · 5.0mm · 0.74mm/px · z∈[-100,+62]mm · 7 of 56 slices shown]
[im 1/56]
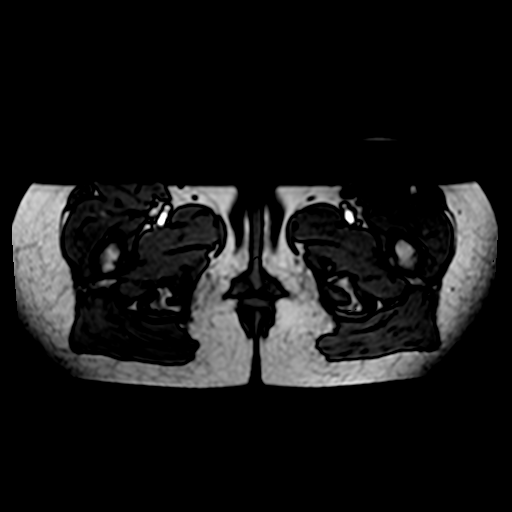
[im 10/56]
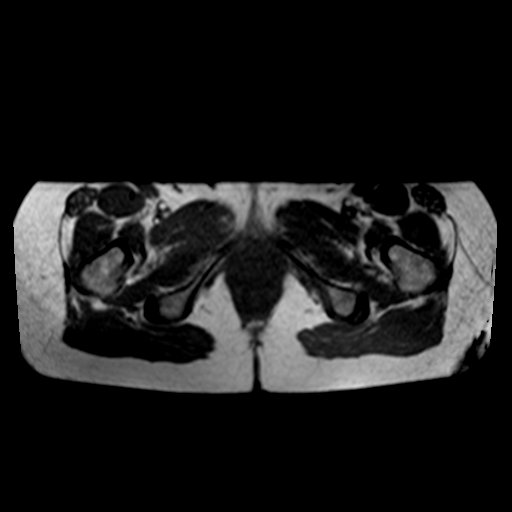
[im 19/56]
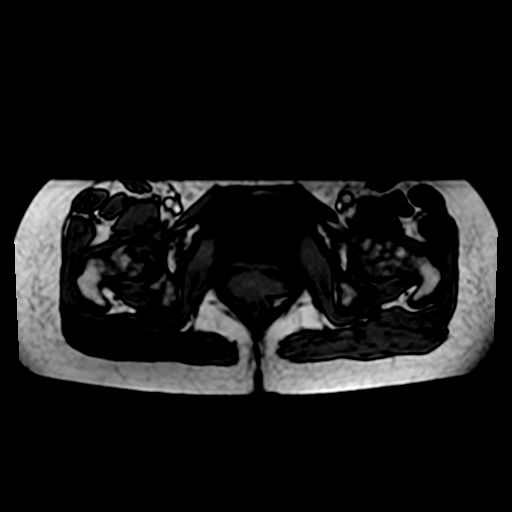
[im 28/56]
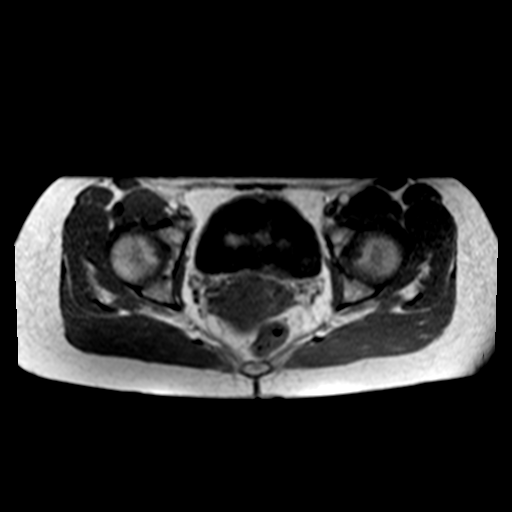
[im 37/56]
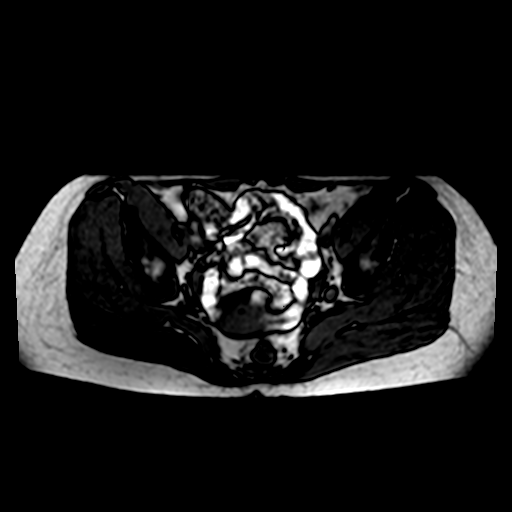
[im 46/56]
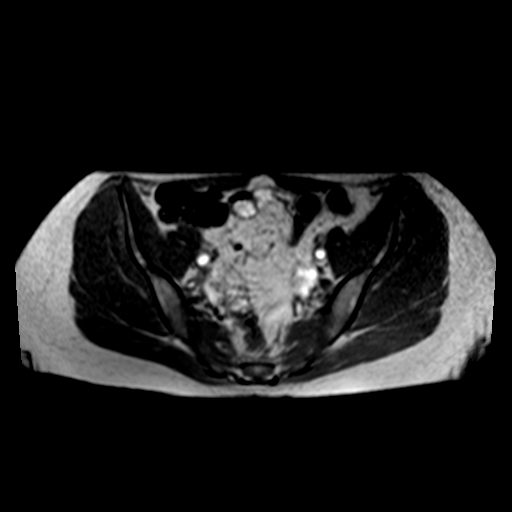
[im 56/56]
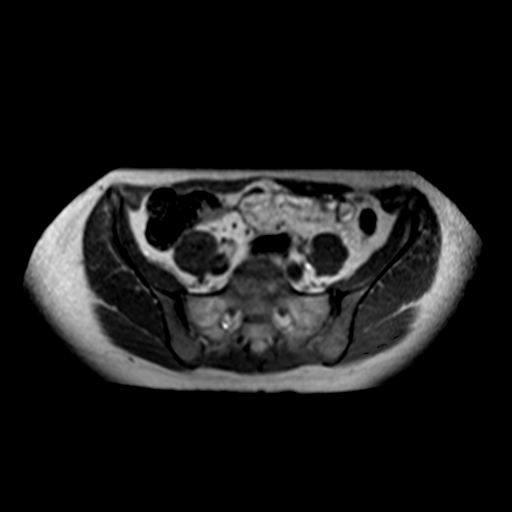

[Series 8: axial spgr fs · axial · non-contrast · 4.0mm · 0.39mm/px · z∈[-91,+53]mm · 5 of 37 slices shown]
[im 1/37]
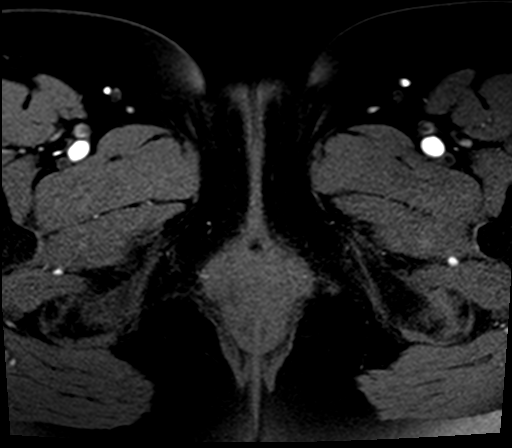
[im 10/37]
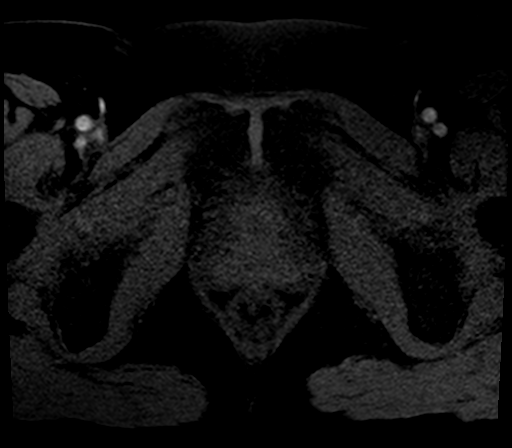
[im 19/37]
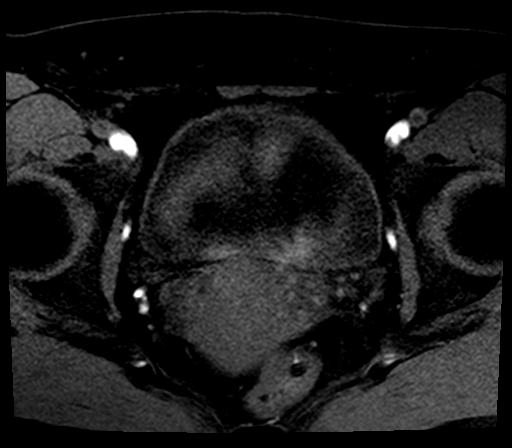
[im 28/37]
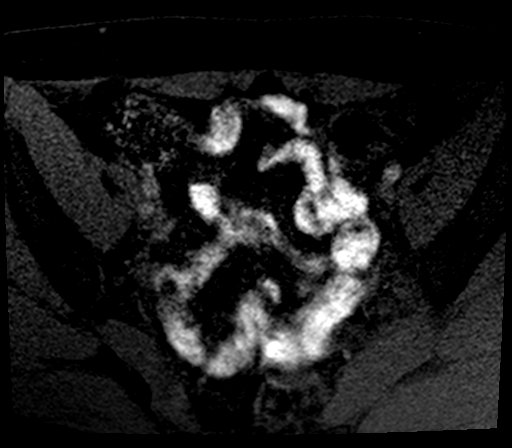
[im 37/37]
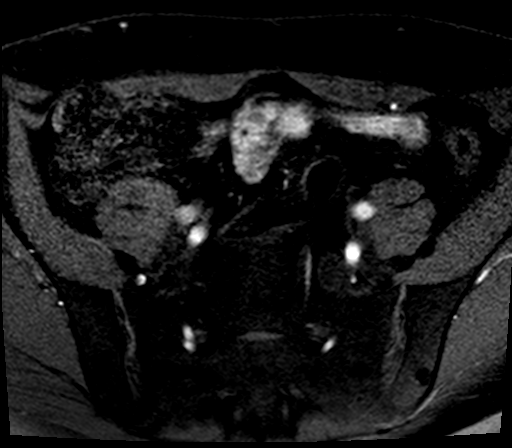

[Series 9: T1 dynamic · axial · non-contrast · 4.0mm · 0.49mm/px · z∈[-87,-51]mm · 2 of 40 slices shown]
[im 1/40]
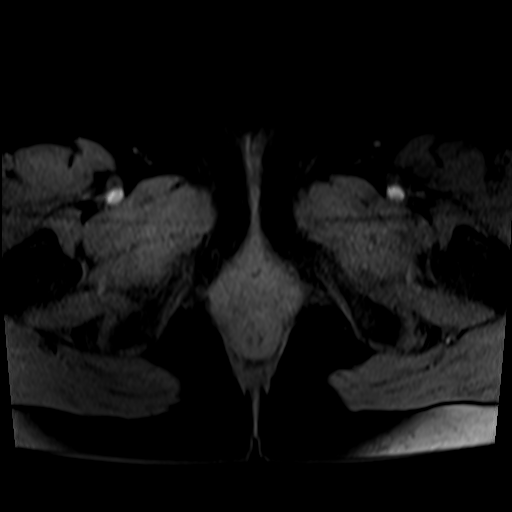
[im 10/40]
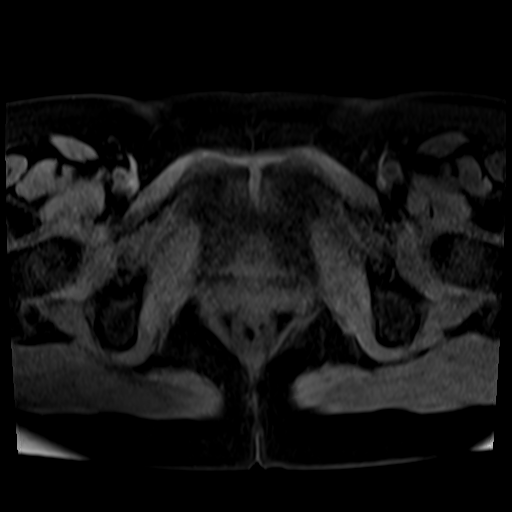

[30 of 48 positions shown; findings below may reference images not displayed]

FINDINGS: Lower Urinary Tract: No urinary bladder or urethral abnormality
identified. No evidence of urethral diverticulum, or other
periurethral cystic lesion or mass.

Bowel: Unremarkable pelvic bowel loops.

Vascular/Lymphatic: Unremarkable. No pathologically enlarged pelvic
lymph nodes identified.

Reproductive:

-- Uterus: Measures 6.8 x 4.3 by 4.9 cm (volume = 75 cm^3). 2.1 cm
fibroid with submucosal component is seen in the anterior corpus. No
other fibroids identified. Normal endometrial thickness. Cervix and
vagina are unremarkable.

-- Right ovary: Normal postmenopausal size and appearance. No
ovarian or adnexal masses identified.

-- Left ovary: Normal postmenopausal size and appearance. No ovarian
or adnexal masses identified.

Other: No peritoneal thickening or abnormal free fluid.

Musculoskeletal:  Unremarkable.
IMPRESSION: Normal appearance of bladder and urethra. No evidence of urethral
diverticulum or mass.

2.1 cm anterior uterine fibroid.

## 2021-03-11 MED ORDER — GADOBENATE DIMEGLUMINE 529 MG/ML IV SOLN
12.0000 mL | Freq: Once | INTRAVENOUS | Status: AC | PRN
  Administered 2021-03-11: 12 mL via INTRAVENOUS

## 2021-03-22 ENCOUNTER — Ambulatory Visit: Payer: 59 | Attending: Surgery

## 2021-03-22 ENCOUNTER — Other Ambulatory Visit: Payer: Self-pay

## 2021-03-22 VITALS — Wt 134.4 lb

## 2021-03-22 DIAGNOSIS — Z483 Aftercare following surgery for neoplasm: Secondary | ICD-10-CM | POA: Insufficient documentation

## 2021-03-22 NOTE — Therapy (Signed)
[facility] @ [address] [place], [place], [address] Phone: [phone]   Fax:  [phone]  Physical Therapy Treatment  Patient Details  Name: April Yoder MRN: [id number] Date of Birth: 05/03/1970 Referring Provider (PT): Alvino Chapel   Encounter Date: 03/22/2021   PT End of Session - 03/22/21 1555     Visit Number 4   # unchanged due to screen only   PT Start Time 1791    PT Stop Time 1559    PT Time Calculation (min) 5 min    Activity Tolerance Patient tolerated treatment well    Behavior During Therapy [facility] for tasks assessed/performed             No past medical history on file.  Past Surgical History:  Procedure Laterality Date   APPENDECTOMY     CHOLECYSTECTOMY     KIDNEY SURGERY Left    left kidney removed   SHOULDER ARTHROSCOPY Bilateral     Vitals:   03/22/21 1557  Weight: 134 lb 6 oz (61 kg)     Subjective Assessment - 03/22/21 1554     Subjective Pt returns for her 3 month L-Dex screen.    Pertinent History L breast cancer, ER/PR-Her2+, pt underwent bilateral mastectomy (radical mastectomy on the L) and L SLNB (1/1) on 06/03/20, currently undergoing chemo and herceptin treatments- will begin radiation after chemo, pt has 1 kidney, pt has had appendectomy and gallbladder removal, hx of bilateral shoulder surgeries                    L-DEX FLOWSHEETS - 03/22/21 1500       L-DEX LYMPHEDEMA SCREENING   Measurement Type Unilateral    L-DEX MEASUREMENT EXTREMITY Upper Extremity    POSITION  Standing    DOMINANT SIDE Left    At Risk Side Left    BASELINE SCORE (UNILATERAL) 4.1    L-DEX SCORE (UNILATERAL) 4.5    VALUE CHANGE (UNILAT) 0.4                                     PT Long Term Goals - 08/18/20 1038       PT LONG TERM GOAL #1   Title Pt will return to baseline shoulder ROM and not demonstrate any signs or symptoms of lymphedema     Baseline lacking right shoulder extension    Time 6    Period Weeks    Status Partially Met      PT LONG TERM GOAL #2   Title Pt will report no tightness with end range bilateral shoulder ROM to allow her to return to PLOF.    Baseline feels slight tightness at end range but with functional ROM    Time 4    Period Weeks    Status Partially Met    Target Date 08/18/20      PT LONG TERM GOAL #3   Title Pt will be independent in a home exercise program for continued strengthening and stretching.    Time 4    Period Weeks    Status Achieved                   Plan - 03/22/21 1559     Clinical Impression Statement Pt returns for her 3 month L-Dex screen. Her change from baseline of 0.4 is WNLs so no further treatment  is required at this time except to cont every 3 month L-Dex screens which pt is agreeable to.    PT Next Visit Plan Cont every 3 month L-Dex screens for up to 2 years from her SLNB (~06/04/22)    Consulted and Agree with Plan of Care Patient             Patient will benefit from skilled therapeutic intervention in order to improve the following deficits and impairments:     Visit Diagnosis: Aftercare following surgery for neoplasm     Problem List Patient Active Problem List   Diagnosis Date Noted   Malignant neoplasm of lower-inner quadrant of left breast in female, estrogen receptor negative ([place]) 05/05/2020    Otelia Limes, PTA 03/22/2021, 4:01 PM  [facility] @ [place], [place], [address] Phone: [phone]   Fax:  [phone]  Name: April Yoder MRN: [id number] Date of Birth: 03-05-1970

## 2021-06-07 ENCOUNTER — Ambulatory Visit: Payer: 59 | Attending: Surgery

## 2021-06-07 VITALS — Wt 134.1 lb

## 2021-06-07 DIAGNOSIS — Z483 Aftercare following surgery for neoplasm: Secondary | ICD-10-CM | POA: Insufficient documentation

## 2021-06-07 NOTE — Therapy (Signed)
?  OUTPATIENT PHYSICAL THERAPY SOZO SCREENING NOTE ? ? ?Patient Name: April Yoder ?MRN: [id number] ?DOB:[birth year], 51 y.o., female ?Today's Date: 06/07/2021 ? ?PCP: Wenda Low, MD ?REFERRING PROVIDER: Wenda Low, MD ? ? PT End of Session - 06/07/21 1505   ? ? Visit Number 4   # unchanged due to screen only  ? PT Start Time 1503   ? PT Stop Time 1507   ? PT Time Calculation (min) 4 min   ? Activity Tolerance Patient tolerated treatment well   ? Behavior During Therapy [facility] for tasks assessed/performed   ? ?  ?  ? ?  ? ? ?No past medical history on file. ?Past Surgical History:  ?Procedure Laterality Date  ? APPENDECTOMY    ? CHOLECYSTECTOMY    ? KIDNEY SURGERY Left   ? left kidney removed  ? SHOULDER ARTHROSCOPY Bilateral   ? ?Patient Active Problem List  ? Diagnosis Date Noted  ? Malignant neoplasm of lower-inner quadrant of left breast in female, estrogen receptor negative ([place]) 05/05/2020  ? ? ?REFERRING DIAG: left breast cancer at risk for lymphedema ? ?THERAPY DIAG:  ?Aftercare following surgery for neoplasm ? ?PERTINENT HISTORY: L breast cancer, ER/PR-Her2+, pt underwent bilateral mastectomy (radical mastectomy on the L) and L SLNB (1/1) on 06/03/20, currently undergoing chemo and herceptin treatments- will begin radiation after chemo, pt has 1 kidney, pt has had appendectomy and gallbladder removal, hx of bilateral shoulder surgeries  ? ?PRECAUTIONS: left UE Lymphedema risk, None ? ?SUBJECTIVE: Pt returns for her 3 month L-Dex screen.  ? ?PAIN:  ?Are you having pain? No ? ?SOZO SCREENING: ?Patient was assessed today using the SOZO machine to determine the lymphedema index score. This was compared to her baseline score. It was determined that she is within the recommended range when compared to her baseline and no further action is needed at this time. She will continue SOZO screenings. These are done every 3 months for 2 years post operatively followed by every 6 months for 2 years, and then  annually. ? ? ? ?Otelia Limes, PTA ?06/07/2021, 3:10 PM ? ?  ? ?

## 2021-10-11 ENCOUNTER — Ambulatory Visit: Payer: 59 | Attending: Surgery

## 2021-10-11 VITALS — Wt 135.2 lb

## 2021-10-11 DIAGNOSIS — Z483 Aftercare following surgery for neoplasm: Secondary | ICD-10-CM | POA: Insufficient documentation

## 2021-10-11 NOTE — Therapy (Signed)
  OUTPATIENT PHYSICAL THERAPY SOZO SCREENING NOTE   Patient Name: April Yoder MRN: [id number] DOB:1970-03-22, 51 y.o., female Today's Date: 10/11/2021  PCP: Wenda Low, MD REFERRING PROVIDER: Nicholas Lose, MD   PT End of Session - 10/11/21 1553     Visit Number 4   # unchanged due to screen only   PT Start Time 1551    PT Stop Time 1555    PT Time Calculation (min) 4 min    Activity Tolerance Patient tolerated treatment well    Behavior During Therapy [facility] for tasks assessed/performed             No past medical history on file. Past Surgical History:  Procedure Laterality Date   APPENDECTOMY     CHOLECYSTECTOMY     KIDNEY SURGERY Left    left kidney removed   SHOULDER ARTHROSCOPY Bilateral    Patient Active Problem List   Diagnosis Date Noted   Malignant neoplasm of lower-inner quadrant of left breast in female, estrogen receptor negative ([place]) 05/05/2020    REFERRING DIAG: left breast cancer at risk for lymphedema  THERAPY DIAG:  Aftercare following surgery for neoplasm  PERTINENT HISTORY: L breast cancer, ER/PR-Her2+, pt underwent bilateral mastectomy (radical mastectomy on the L) and L SLNB (1/1) on 06/03/20, currently undergoing chemo and herceptin treatments- will begin radiation after chemo, pt has 1 kidney, pt has had appendectomy and gallbladder removal, hx of bilateral shoulder surgeries   PRECAUTIONS: left UE Lymphedema risk, None  SUBJECTIVE: Pt returns for her 3 month L-Dex screen.   PAIN:  Are you having pain? No  SOZO SCREENING: Patient was assessed today using the SOZO machine to determine the lymphedema index score. This was compared to her baseline score. It was determined that she is within the recommended range when compared to her baseline and no further action is needed at this time. She will continue SOZO screenings. These are done every 3 months for 2 years post operatively followed by every 6 months for 2 years, and then  annually.   L-DEX FLOWSHEETS - 10/11/21 1500       L-DEX LYMPHEDEMA SCREENING   Measurement Type Unilateral    L-DEX MEASUREMENT EXTREMITY Upper Extremity    POSITION  Standing    DOMINANT SIDE Left    At Risk Side Left    BASELINE SCORE (UNILATERAL) 4.1    L-DEX SCORE (UNILATERAL) 2.5    VALUE CHANGE (UNILAT) -1.6              Otelia Limes, PTA 10/11/2021, 3:54 PM

## 2022-01-10 ENCOUNTER — Ambulatory Visit: Payer: 59 | Attending: Surgery

## 2022-01-10 VITALS — Wt 135.1 lb

## 2022-01-10 DIAGNOSIS — Z483 Aftercare following surgery for neoplasm: Secondary | ICD-10-CM | POA: Insufficient documentation

## 2022-01-10 NOTE — Therapy (Signed)
  OUTPATIENT PHYSICAL THERAPY SOZO SCREENING NOTE   Patient Name: April Yoder MRN: [id number] DOB:08-15-1970, 51 y.o., female Today's Date: 01/10/2022  PCP: Wenda Low, MD REFERRING PROVIDER: Nicholas Lose, MD   PT End of Session - 01/10/22 1057     Visit Number 4   # unchanged due to screen only   PT Start Time 1055    PT Stop Time 1059    PT Time Calculation (min) 4 min    Activity Tolerance Patient tolerated treatment well    Behavior During Therapy [facility] for tasks assessed/performed             No past medical history on file. Past Surgical History:  Procedure Laterality Date   APPENDECTOMY     CHOLECYSTECTOMY     KIDNEY SURGERY Left    left kidney removed   SHOULDER ARTHROSCOPY Bilateral    Patient Active Problem List   Diagnosis Date Noted   Malignant neoplasm of lower-inner quadrant of left breast in female, estrogen receptor negative ([place]) 05/05/2020    REFERRING DIAG: left breast cancer at risk for lymphedema  THERAPY DIAG: Aftercare following surgery for neoplasm  PERTINENT HISTORY: L breast cancer, ER/PR-Her2+, pt underwent bilateral mastectomy (radical mastectomy on the L) and L SLNB (1/1) on 06/03/20, currently undergoing chemo and herceptin treatments- will begin radiation after chemo, pt has 1 kidney, pt has had appendectomy and gallbladder removal, hx of bilateral shoulder surgeries   PRECAUTIONS: left UE Lymphedema risk, None  SUBJECTIVE: Pt returns for her 3 month L-Dex screen.   PAIN:  Are you having pain? No  SOZO SCREENING: Patient was assessed today using the SOZO machine to determine the lymphedema index score. This was compared to her baseline score. It was determined that she is within the recommended range when compared to her baseline and no further action is needed at this time. She will continue SOZO screenings. These are done every 3 months for 2 years post operatively followed by every 6 months for 2 years, and then annually.    L-DEX FLOWSHEETS - 01/10/22 1000       L-DEX LYMPHEDEMA SCREENING   Measurement Type Unilateral    L-DEX MEASUREMENT EXTREMITY Upper Extremity    POSITION  Standing    DOMINANT SIDE Left    At Risk Side Left    BASELINE SCORE (UNILATERAL) 4.1    L-DEX SCORE (UNILATERAL) 0.3    VALUE CHANGE (UNILAT) -3.8              Otelia Limes, PTA 01/10/2022, 10:58 AM

## 2022-03-23 NOTE — Progress Notes (Signed)
NEUROLOGY FOLLOW UP OFFICE NOTE  April Yoder [id number]  Assessment/Plan:   Migraine with aura, without status migrainosus, not intractable Daily tension-type headaches Cerebral cavernoma  Check MRI of brain with and without contrast to follow up on cerebral cavernoma Prednisone taper to break current daily headache Migraine rescue:  rizatriptan '10mg'$  Limit use of pain relievers to no more than 2 days out of week to prevent risk of rebound or medication-overuse headache. Keep headache diary Follow up 4 to 5 months.   Subjective:  April Yoder is a 52 year old female who follows up for migraines.  MRI of brain from 08/28/2019 personally reviewed.   UPDATE: Last seen on 07/24/2019.  MRI of brain with and without contrast on 08/28/2019 to follow up on left frontal cavernoma was stable at 12 mm compared to prior imaging on 07/04/2018 with slight prominent venous vessel in this region (possibly small DVA) without associated edema or hemorrhage.  Since last visit, she has been diagnosed with breast cancer in 2022.  She has undergone bilateral mastectomy and chemotherapy.  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 Dose of Taxotere has been reduced.    She continues to have migraines. Intensity:  5-6/10 Duration:  Takes rizatriptan and goes to bed.  She wakes up after a couple hours and headache is reduced at 1-2/10 Frequency:  6 to 7 days over past year.    On 1/3, she had a particularly different migraine.  It didn't respond quickly to rizatriptan.  She then had a second severe migraine later that day, which never before happened.  She had mild migraines for the first 6-7 days after that one.    Since then, she has been waking up every morning 1/10 occipital and frontal non-throbbing headache and may progress to 3/10.  No aura but with photophobia.    Now that she stopped chemo, she has had increased migraines.  Normally they occur at least once a month, aborts quickly with  rizatriptan.   Current NSAIDS:  none Current analgesics:  none Current triptans:  Rizatriptan '10mg'$  Current ergotamine:  none Current anti-emetic:  none Current muscle relaxants:  none Current anti-anxiolytic:  alprazolam Current sleep aide:  none Current Antihypertensive medications:  none Current Antidepressant medications:  none Current Anticonvulsant medications:  none Current anti-CGRP:  none Current Vitamins/Herbal/Supplements:  MVI Current Antihistamines/Decongestants:  Claritin Other therapy:  none   HISTORY:  In April 2020, she had visual disturbance described as flashes in vision with development of scotoma surrounded by bright flashes or prism effect.  It involves both eyes.  She is unable to see. Towards the end of the day, she developed a headache, severe left frontal non-throbbing headache.  It was associated with dizziness but not nausea, vomiting, photophobia, phonophobia or unilateral numbness or weakness.  Visual aura and headache are severe the first day but both symptoms last and gradually resolve over 3 to 4 days.  No specific triggers.  Resolved spontaneously.  She had an eye exam which was normal.   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  No stroke.   She had a similar event in summer of 2019 but not as severe, lasting about a day.   No history of headaches.  Her sister has migraines.  PAST MEDICAL HISTORY: No past medical history on file.  MEDICATIONS: Current Outpatient Medications on File Prior to Visit  Medication Sig Dispense Refill   ALPRAZolam (  XANAX) 0.25 MG tablet Take 0.25 mg by mouth daily as needed.     loratadine (CLARITIN) 10 MG tablet Take 10 mg by mouth daily.     Multiple Vitamin (MULTIVITAMIN) tablet Take 1 tablet by mouth daily.     rizatriptan (MAXALT) 10 MG tablet Take 1 tablet earliest onset of migraine.  May repeat in 2 hours if needed.   Maximum 2 tablets in 24 hrs. 10 tablet 0   No current facility-administered medications on file prior to visit.     ALLERGIES: Not on File  FAMILY HISTORY: Family History  Problem Relation Age of Onset   Breast cancer Mother 92   Polycystic kidney disease Father    Polycystic kidney disease Sister       Objective:  Blood pressure 110/76, pulse 82, height '5\' 6"'$  (1.676 m), weight 139 lb 3.2 oz (63.1 kg), SpO2 99 %. General: No acute distress.  Patient appears well-groomed.   Head:  Normocephalic/atraumatic Eyes:  Fundi examined but not visualized Neck: supple, no paraspinal tenderness, full range of motion Heart:  Regular rate and rhythm Neurological Exam: alert and oriented to person, place, and time.  Speech fluent and not dysarthric, language intact.  CN II-XII intact. Bulk and tone normal, muscle strength 5/5 throughout.  Sensation to temperature and vibration intact.  Deep tendon reflexes 2+ throughout, toes downgoing.  Finger to nose testing intact.  Gait normal, Romberg negative.   Metta Clines, DO  CC: Wenda Low, MD

## 2022-03-25 ENCOUNTER — Ambulatory Visit: Payer: 59 | Admitting: Neurology

## 2022-03-25 ENCOUNTER — Encounter: Payer: Self-pay | Admitting: Neurology

## 2022-03-25 VITALS — BP 110/76 | HR 82 | Ht 66.0 in | Wt 139.2 lb

## 2022-03-25 DIAGNOSIS — G44209 Tension-type headache, unspecified, not intractable: Secondary | ICD-10-CM | POA: Diagnosis not present

## 2022-03-25 DIAGNOSIS — G43119 Migraine with aura, intractable, without status migrainosus: Secondary | ICD-10-CM | POA: Diagnosis not present

## 2022-03-25 DIAGNOSIS — Q283 Other malformations of cerebral vessels: Secondary | ICD-10-CM | POA: Diagnosis not present

## 2022-03-25 MED ORDER — PREDNISONE 10 MG PO TABS: ORAL_TABLET | ORAL | 0 refills | Status: DC

## 2022-03-25 NOTE — Patient Instructions (Signed)
Take 6tabs x1day, then 5tabs x1day, then 4tabs x1day, then 3tabs x1day, then 2tabs x1day, then 1tab x1day, then STOP MRI of brain with and without contrast Limit use of pain relievers to no more than 2 days out of week to prevent risk of rebound or medication-overuse headache. Follow up 4 to 5 months.

## 2022-03-25 NOTE — Addendum Note (Signed)
Addended by: Venetia Night on: 03/25/2022 02:34 PM   Modules accepted: Orders

## 2022-04-12 ENCOUNTER — Ambulatory Visit: Payer: 59 | Attending: Surgery

## 2022-04-12 VITALS — Wt 140.0 lb

## 2022-04-12 DIAGNOSIS — Z483 Aftercare following surgery for neoplasm: Secondary | ICD-10-CM | POA: Insufficient documentation

## 2022-04-12 NOTE — Therapy (Signed)
  OUTPATIENT PHYSICAL THERAPY SOZO SCREENING NOTE   Patient Name: April Yoder MRN: [id number] DOB:06-28-1970, 52 y.o., female Today's Date: 04/12/2022  PCP: Wenda Low, MD REFERRING PROVIDER: Nicholas Lose, MD   PT End of Session - 04/12/22 1007     Visit Number 4   # unchanged due to screen only   PT Start Time 1004    PT Stop Time 1008    PT Time Calculation (min) 4 min    Activity Tolerance Patient tolerated treatment well    Behavior During Therapy [facility] for tasks assessed/performed             No past medical history on file. Past Surgical History:  Procedure Laterality Date   APPENDECTOMY     CHOLECYSTECTOMY     KIDNEY SURGERY Left    left kidney removed   SHOULDER ARTHROSCOPY Bilateral    Patient Active Problem List   Diagnosis Date Noted   Malignant neoplasm of lower-inner quadrant of left breast in female, estrogen receptor negative ([place]) 05/05/2020    REFERRING DIAG: left breast cancer at risk for lymphedema  THERAPY DIAG: Aftercare following surgery for neoplasm  PERTINENT HISTORY: L breast cancer, ER/PR-Her2+, pt underwent bilateral mastectomy (radical mastectomy on the L) and L SLNB (1/1) on 06/03/20, currently undergoing chemo and herceptin treatments- will begin radiation after chemo, pt has 1 kidney, pt has had appendectomy and gallbladder removal, hx of bilateral shoulder surgeries   PRECAUTIONS: left UE Lymphedema risk, None  SUBJECTIVE: Pt returns for her 3 month L-Dex screen.   PAIN:  Are you having pain? No  SOZO SCREENING: Patient was assessed today using the SOZO machine to determine the lymphedema index score. This was compared to her baseline score. It was determined that she is within the recommended range when compared to her baseline and no further action is needed at this time. She will continue SOZO screenings. These are done every 3 months for 2 years post operatively followed by every 6 months for 2 years, and then  annually.  Pt will begin every 6 month L-Dex screens now as she is near her 2 years from surgery and her change from baseline has stayed low.    L-DEX FLOWSHEETS - 04/12/22 1000       L-DEX LYMPHEDEMA SCREENING   Measurement Type Unilateral    L-DEX MEASUREMENT EXTREMITY Upper Extremity    POSITION  Standing    DOMINANT SIDE Left    At Risk Side Left    BASELINE SCORE (UNILATERAL) 4.1    L-DEX SCORE (UNILATERAL) 2.3    VALUE CHANGE (UNILAT) -1.8              Otelia Limes, PTA 04/12/2022, 10:10 AM

## 2022-04-20 ENCOUNTER — Ambulatory Visit
Admission: RE | Admit: 2022-04-20 | Discharge: 2022-04-20 | Disposition: A | Discharge status: Home / Self Care | Payer: 59 | Source: Ambulatory Visit | Attending: Neurology | Admitting: Neurology

## 2022-04-20 DIAGNOSIS — G44209 Tension-type headache, unspecified, not intractable: Secondary | ICD-10-CM

## 2022-04-20 DIAGNOSIS — Q283 Other malformations of cerebral vessels: Secondary | ICD-10-CM

## 2022-04-20 MED ORDER — GADOPICLENOL 0.5 MMOL/ML IV SOLN
7.0000 mL | Freq: Once | INTRAVENOUS | Status: AC | PRN
  Administered 2022-04-20: 7 mL via INTRAVENOUS

## 2022-08-01 NOTE — Progress Notes (Deleted)
NEUROLOGY FOLLOW UP OFFICE NOTE  ADALIE HANBERRY [id number]  Assessment/Plan:   Migraine with aura, without status migrainosus, not intractable Daily tension-type headaches Cerebral cavernoma  Migraine prevention:  *** Migraine rescue:  rizatriptan 10mg  *** Limit use of pain relievers to no more than 2 days out of week to prevent risk of rebound or medication-overuse headache. Keep headache diary Follow up ***   Subjective:  April Yoder is a 52 year old female with history of breast cancer who follows up for migraines.  MRI of brain from 08/28/2019 personally reviewed.   UPDATE: Follow up MRI of brain with and without contrast on 04/20/2022 personally reviewed revealed stable cavernous malformation in the left frontal lobe.  She received a prednisone taper in January to break intractable migraine.  ***  Intensity:  5-6/10 Duration:  Takes rizatriptan and goes to bed.  She wakes up after a couple hours and headache is reduced at 1-2/10 Frequency:  6 to 7 days over past year.    On 1/3, she had a particularly different migraine.  It didn't respond quickly to rizatriptan.  She then had a second severe migraine later that day, which never before happened.  She had mild migraines for the first 6-7 days after that one.    Since then, she has been waking up every morning 1/10 occipital and frontal non-throbbing headache and may progress to 3/10.  No aura but with photophobia.    Now that she stopped chemo, she has had increased migraines.  Normally they occur at least once a month, aborts quickly with rizatriptan.   Current NSAIDS:  none Current analgesics:  none Current triptans:  Rizatriptan 10mg  Current ergotamine:  none Current anti-emetic:  none Current muscle relaxants:  none Current anti-anxiolytic:  alprazolam Current sleep aide:  none Current Antihypertensive medications:  none Current Antidepressant medications:  none Current Anticonvulsant medications:   none Current anti-CGRP:  none Current Vitamins/Herbal/Supplements:  MVI Current Antihistamines/Decongestants:  Claritin Other therapy:  none   HISTORY:  In April 2020, she had visual disturbance described as flashes in vision with development of scotoma surrounded by bright flashes or prism effect.  It involves both eyes.  She is unable to see. Towards the end of the day, she developed a headache, severe left frontal non-throbbing headache.  It was associated with dizziness but not nausea, vomiting, photophobia, phonophobia or unilateral numbness or weakness.  Visual aura and headache are severe the first day but both symptoms last and gradually resolve over 3 to 4 days.  No specific triggers.  Resolved spontaneously.  She had an eye exam which was normal.   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  No stroke.  MRI of brain with and without contrast on 08/28/2019 to follow up on left frontal cavernoma was stable at 12 mm compared to prior imaging on 07/04/2018 with slight prominent venous vessel in this region (possibly small DVA) without associated edema or hemorrhage.   She had a similar event in summer of 2019 but not as severe, lasting about a day.   No history of headaches.  Her sister has migraines.  PAST MEDICAL HISTORY: No past medical history on file.  MEDICATIONS: Current Outpatient Medications on File Prior to Visit  Medication Sig Dispense Refill   ALPRAZolam (XANAX) 0.25 MG tablet Take 0.25 mg by mouth daily as needed.     loratadine (CLARITIN) 10 MG tablet Take  10 mg by mouth daily.     Multiple Vitamin (MULTIVITAMIN) tablet Take 1 tablet by mouth daily.     predniSONE (DELTASONE) 10 MG tablet Take 60mg  on day 1, then 50mg  on day 2, then 40mg  on day 3, then 30mg  on day 4, then 20mg  on day 5, then 10mg  on day 6, then STOP 21 tablet 0   rizatriptan (MAXALT) 10 MG tablet Take 1 tablet  earliest onset of migraine.  May repeat in 2 hours if needed.  Maximum 2 tablets in 24 hrs. 10 tablet 0   No current facility-administered medications on file prior to visit.     ALLERGIES: Not on File  FAMILY HISTORY: Family History  Problem Relation Age of Onset   Breast cancer Mother 45   Polycystic kidney disease Father    Polycystic kidney disease Sister       Objective:  *** General: No acute distress.  Patient appears well-groomed.   Head:  Normocephalic/atraumatic Eyes:  Fundi examined but not visualized Neck: supple, no paraspinal tenderness, full range of motion Heart:  Regular rate and rhythm Neurological Exam: ***   Shon Millet, DO  CC: Georgann Housekeeper, MD

## 2022-08-02 ENCOUNTER — Ambulatory Visit: Payer: 59 | Admitting: Neurology

## 2022-10-18 ENCOUNTER — Ambulatory Visit: Payer: 59

## 2023-03-03 ENCOUNTER — Ambulatory Visit: Payer: 59 | Admitting: Neurology

## 2023-06-12 ENCOUNTER — Telehealth: Payer: Self-pay | Admitting: Neurology

## 2023-06-12 NOTE — Telephone Encounter (Signed)
 Per last results note 04/22/22, MRI looks stable.  The benign cavernoma is unchanged.  No concerning abnormalities.

## 2023-06-12 NOTE — Telephone Encounter (Signed)
 Patient called to see if she can go ahead and get an MRI order sent in. She is overdue and since Dr.Jaffe is out and she has been rescheduled to next month, she would like to see if she can go ahead and get an appt sch for her next MRI. She said every time she sees [place], he sents her for an MRI. Would like to speak with sheena about this

## 2023-06-12 NOTE — Telephone Encounter (Signed)
 Dr. Sherilyn Dings notes reviewed, the last 2 brain scans were stable. Is she having any new symptoms? If no new symptoms, would wait for Dr. Festus Hubert to decide about ordering repeat scan, thanks

## 2023-06-12 NOTE — Telephone Encounter (Signed)
 Patient advised of note below

## 2023-06-15 ENCOUNTER — Ambulatory Visit: Payer: 59 | Admitting: Neurology

## 2023-07-04 NOTE — Progress Notes (Unsigned)
 NEUROLOGY FOLLOW UP OFFICE NOTE  April Yoder [id number]  Assessment/Plan:   New onset chronic headache, possibly tension type headache triggered by recent stressors and allergies or sinus infection. Migraine with aura, without status migrainosus, not intractable Daily tension-type headaches Cerebral cavernoma  Check MRI of brain with and without contrast to follow up on cerebral cavernoma Migraine rescue:  rizatriptan  10mg  - refills sent to her pharmacy Limit use of pain relievers to no more than 9 days out of the month to prevent risk of rebound or medication-overuse headache. Keep headache diary Further recommendations pending results.  As long as migraines remain controlled, she wishes to have future refills of rizatriptan  filled by her PCP and follow up with me as needed.   Subjective:  April Yoder is a 53 year old female who follows up for migraines.  MRI of brain from 08/28/2019 personally reviewed.  MRI of brain from 04/20/2022 personally reviewed.   UPDATE: Last seen on 03/25/2022.  At that time she started experiencing a new dull headache.  On 03/02/2022, she had two particularly severe migraine that didn't respond to rizatriptan .  Afterwards, she started waking up every morning with a 1-3/10 non-throbbing occipital and frontal headache with photophobia but no aura.  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  Those headaches subsequently resolved.  Since then, migraines have been well controlled.  They are 5-6/10 intensity and tend to only occur during the early Spring when the occur about 3 to 4 times a month.  Responds in a couple of hours to rizatriptan .  In late January, she started experiencing a new headache, which lasted up until mid-April.  It was an aching pain above the right eye, no more than 3-4/10 severity.  Lasts 5-6 hours with Tylenol  (which she only took about twice a month), otherwise all day.  It would not respond to rizatriptan .  They occurred 2/3 of the month.  No associated aura, nausea, vomiting, photophobia, phonophobia, vision loss, jaw claudication.  She did have a sinus infection that required two rounds of antibiotics.  At that time, she was also under more stress having to go back and forth to [place]  on the plan in order to care for her father.  Eye exam was normal.  They spontaneously resolved and it has been 2 1/2 weeks since she last had this headache.    Current NSAIDS:  none Current analgesics:  none Current triptans:  Rizatriptan  10mg  Current ergotamine:  none Current anti-emetic:  none Current muscle relaxants:  none Current anti-anxiolytic:  alprazolam Current sleep aide:  none Current Antihypertensive medications:  none Current Antidepressant medications:  none Current Anticonvulsant medications:  none Current anti-CGRP:  none Current Vitamins/Herbal/Supplements:  MVI Current Antihistamines/Decongestants:  Claritin Other therapy:  none   HISTORY:  Migraine with Aura: No prior history of headaches.  In April 2020, she had visual disturbance described as flashes in vision with development of scotoma surrounded by bright flashes or prism  effect.  It involves both eyes.  She is unable to see. Towards the end of the day, she developed a headache, severe left frontal non-throbbing headache.  It was associated with dizziness but not nausea, vomiting, photophobia, phonophobia or unilateral numbness or weakness.  Visual aura and headache are severe the first day but both symptoms last and gradually resolve over 3 to 4 days.  No specific triggers.  Resolved spontaneously.  She had an eye exam which was normal.   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  No stroke.  MRI of brain with and without contrast on  08/28/2019 to follow up on left frontal cavernoma was stable at 12 mm compared to prior imaging on 07/04/2018 with slight prominent venous vessel in this region (possibly small DVA) without associated edema or hemorrhage.   Headache 2: On 03/02/2022, she had a particularly different migraine.  It didn't respond quickly to rizatriptan .  She then had a second severe migraine later that day, which never before happened.  She had mild migraines for the first 6-7 days after that one.  Since then, she has been waking up every morning 1/10 occipital and frontal non-throbbing headache and may progress to 3/10.  No aura but with photophobia.    PAST MEDICAL HISTORY: No past medical history on file.  MEDICATIONS: Current Outpatient Medications on File Prior to Visit  Medication Sig Dispense Refill   ALPRAZolam (XANAX) 0.25 MG tablet Take 0.25 mg by mouth daily as needed.     loratadine (CLARITIN) 10 MG tablet Take 10 mg by mouth daily.     Multiple Vitamin (MULTIVITAMIN) tablet Take 1 tablet by mouth daily.     predniSONE  (DELTASONE ) 10 MG tablet Take 60mg  on day 1, then 50mg  on day 2, then 40mg  on day 3, then 30mg  on day 4, then 20mg  on day 5, then 10mg  on day 6, then STOP 21 tablet 0   rizatriptan  (MAXALT ) 10 MG tablet Take 1 tablet earliest onset of migraine.  May repeat in 2 hours if needed.  Maximum 2 tablets in 24 hrs. 10 tablet 0   No current facility-administered medications on file prior to visit.     ALLERGIES: Not on File  FAMILY HISTORY: Family History  Problem Relation Age of Onset   Breast cancer Mother 98   Polycystic kidney disease Father    Polycystic kidney disease Sister       Objective:  Blood pressure 110/76, pulse 82, height 5\' 6"  (1.676 m), weight 139 lb 3.2 oz (63.1 kg), SpO2 99 %. General: No acute distress.  Patient appears well-groomed.   Head:  Normocephalic/atraumatic Eyes:  Fundi examined but not visualized Neck: supple, no paraspinal tenderness, full range of  motion Heart:  Regular rate and rhythm Neurological Exam: alert and oriented to person, place, and time.  Speech fluent and not dysarthric, language intact.  CN II-XII intact. Bulk and tone normal, muscle strength 5/5 throughout.  Sensation to temperature and vibration intact.  Deep tendon reflexes 2+ throughout, toes downgoing.  Finger to nose testing intact.  Gait normal, Romberg negative.   Janne Members, DO  CC: Jearldine Mina, MD

## 2023-07-05 ENCOUNTER — Encounter: Payer: Self-pay | Admitting: Neurology

## 2023-07-05 ENCOUNTER — Ambulatory Visit: Admitting: Neurology

## 2023-07-05 VITALS — BP 125/81 | HR 96 | Ht 66.0 in | Wt 139.0 lb

## 2023-07-05 DIAGNOSIS — G43109 Migraine with aura, not intractable, without status migrainosus: Secondary | ICD-10-CM | POA: Diagnosis not present

## 2023-07-05 DIAGNOSIS — Q283 Other malformations of cerebral vessels: Secondary | ICD-10-CM

## 2023-07-05 DIAGNOSIS — G8929 Other chronic pain: Secondary | ICD-10-CM | POA: Diagnosis not present

## 2023-07-05 DIAGNOSIS — R519 Headache, unspecified: Secondary | ICD-10-CM

## 2023-07-05 MED ORDER — RIZATRIPTAN BENZOATE 10 MG PO TABS: ORAL_TABLET | ORAL | 5 refills | Status: AC

## 2023-07-05 NOTE — Patient Instructions (Signed)
 Will check MRI of brain with and without contrast.  Further recommendations pending results. We have sent a referral to [facility] for your MRI and they will call you directly to schedule your appointment. They are located at [address] [facility]. If you need to contact them directly please call [phone].   Rizatriptan  as needed for migraines.  I sent in refills but further refills may be continued by your PCP if he is agreeable. Otherwise, follow up with me as needed.

## 2023-07-19 ENCOUNTER — Encounter: Payer: Self-pay | Admitting: Neurology

## 2023-08-06 ENCOUNTER — Inpatient Hospital Stay: Admission: RE | Admit: 2023-08-06 | Source: Ambulatory Visit

## 2023-08-07 ENCOUNTER — Ambulatory Visit
Admission: RE | Admit: 2023-08-07 | Discharge: 2023-08-07 | Disposition: A | Discharge status: Home / Self Care | Source: Ambulatory Visit | Attending: Neurology | Admitting: Neurology

## 2023-08-07 DIAGNOSIS — Q283 Other malformations of cerebral vessels: Secondary | ICD-10-CM

## 2023-08-07 DIAGNOSIS — G8929 Other chronic pain: Secondary | ICD-10-CM

## 2023-08-07 MED ORDER — GADOPICLENOL 0.5 MMOL/ML IV SOLN
6.0000 mL | Freq: Once | INTRAVENOUS | Status: AC | PRN
  Administered 2023-08-07: 6 mL via INTRAVENOUS

## 2023-08-11 ENCOUNTER — Ambulatory Visit: Payer: Self-pay | Admitting: Neurology

## 2023-08-11 NOTE — Progress Notes (Signed)
 Patient advised of her MRI. Patient wants to send a mychart message in response to some of the Impression.

## 2023-09-14 ENCOUNTER — Other Ambulatory Visit: Payer: Self-pay | Admitting: Orthopaedic Surgery

## 2023-09-14 DIAGNOSIS — M25562 Pain in left knee: Secondary | ICD-10-CM

## 2023-09-15 ENCOUNTER — Ambulatory Visit
Admission: RE | Admit: 2023-09-15 | Discharge: 2023-09-15 | Disposition: A | Discharge status: Home / Self Care | Source: Ambulatory Visit | Attending: Orthopaedic Surgery | Admitting: Orthopaedic Surgery

## 2023-09-15 DIAGNOSIS — M25562 Pain in left knee: Secondary | ICD-10-CM

## 2023-10-10 ENCOUNTER — Other Ambulatory Visit (HOSPITAL_COMMUNITY): Payer: Self-pay | Admitting: Internal Medicine

## 2023-10-10 DIAGNOSIS — R109 Unspecified abdominal pain: Secondary | ICD-10-CM

## 2023-10-11 ENCOUNTER — Ambulatory Visit
Admission: RE | Admit: 2023-10-11 | Discharge: 2023-10-11 | Disposition: A | Discharge status: Home / Self Care | Source: Ambulatory Visit | Attending: Internal Medicine | Admitting: Internal Medicine

## 2023-10-11 DIAGNOSIS — R109 Unspecified abdominal pain: Secondary | ICD-10-CM

## 2023-10-11 MED ORDER — IOPAMIDOL (ISOVUE-300) INJECTION 61%
100.0000 mL | Freq: Once | INTRAVENOUS | Status: AC | PRN
  Administered 2023-10-11 (×2): 100 mL via INTRAVENOUS

## 2023-11-21 ENCOUNTER — Other Ambulatory Visit: Payer: Self-pay
